# Patient Record
Sex: Female | Born: 1950 | Race: White | Hispanic: No | Marital: Single | Smoking: Former smoker
Health system: Southern US, Community
[De-identification: ages and names within clinical notes are randomized; demographics above are authoritative.]

---

## 2015-09-26 DIAGNOSIS — Z23 Encounter for immunization: Secondary | ICD-10-CM | POA: Diagnosis not present

## 2016-01-22 DIAGNOSIS — K130 Diseases of lips: Secondary | ICD-10-CM | POA: Diagnosis not present

## 2016-01-22 DIAGNOSIS — M810 Age-related osteoporosis without current pathological fracture: Secondary | ICD-10-CM | POA: Diagnosis not present

## 2016-01-22 DIAGNOSIS — F329 Major depressive disorder, single episode, unspecified: Secondary | ICD-10-CM | POA: Diagnosis not present

## 2016-01-22 DIAGNOSIS — E7800 Pure hypercholesterolemia, unspecified: Secondary | ICD-10-CM | POA: Diagnosis not present

## 2016-01-22 DIAGNOSIS — E039 Hypothyroidism, unspecified: Secondary | ICD-10-CM | POA: Diagnosis not present

## 2016-01-22 DIAGNOSIS — K047 Periapical abscess without sinus: Secondary | ICD-10-CM | POA: Diagnosis not present

## 2016-01-22 DIAGNOSIS — J45909 Unspecified asthma, uncomplicated: Secondary | ICD-10-CM | POA: Diagnosis not present

## 2016-08-06 DIAGNOSIS — E039 Hypothyroidism, unspecified: Secondary | ICD-10-CM | POA: Diagnosis not present

## 2016-08-06 DIAGNOSIS — M810 Age-related osteoporosis without current pathological fracture: Secondary | ICD-10-CM | POA: Diagnosis not present

## 2016-08-06 DIAGNOSIS — R636 Underweight: Secondary | ICD-10-CM | POA: Diagnosis not present

## 2016-08-06 DIAGNOSIS — E7800 Pure hypercholesterolemia, unspecified: Secondary | ICD-10-CM | POA: Diagnosis not present

## 2016-08-06 DIAGNOSIS — Z23 Encounter for immunization: Secondary | ICD-10-CM | POA: Diagnosis not present

## 2016-08-06 DIAGNOSIS — Z0000 Encounter for general adult medical examination without abnormal findings: Secondary | ICD-10-CM | POA: Diagnosis not present

## 2016-08-06 DIAGNOSIS — K130 Diseases of lips: Secondary | ICD-10-CM | POA: Diagnosis not present

## 2016-08-06 DIAGNOSIS — F339 Major depressive disorder, recurrent, unspecified: Secondary | ICD-10-CM | POA: Diagnosis not present

## 2016-08-08 ENCOUNTER — Other Ambulatory Visit: Payer: Self-pay | Admitting: Family Medicine

## 2016-08-08 DIAGNOSIS — Z1231 Encounter for screening mammogram for malignant neoplasm of breast: Secondary | ICD-10-CM

## 2016-08-14 ENCOUNTER — Ambulatory Visit
Admission: RE | Admit: 2016-08-14 | Discharge: 2016-08-14 | Disposition: A | Discharge status: Home / Self Care | Payer: Medicare Other | Source: Ambulatory Visit | Attending: Family Medicine | Admitting: Family Medicine

## 2016-08-14 DIAGNOSIS — Z1231 Encounter for screening mammogram for malignant neoplasm of breast: Secondary | ICD-10-CM

## 2016-08-15 ENCOUNTER — Other Ambulatory Visit: Payer: Self-pay | Admitting: Family Medicine

## 2016-08-15 DIAGNOSIS — R928 Other abnormal and inconclusive findings on diagnostic imaging of breast: Secondary | ICD-10-CM

## 2016-09-16 ENCOUNTER — Other Ambulatory Visit: Payer: Medicare Other

## 2016-09-23 ENCOUNTER — Ambulatory Visit
Admission: RE | Admit: 2016-09-23 | Discharge: 2016-09-23 | Disposition: A | Discharge status: Home / Self Care | Payer: Medicare Other | Source: Ambulatory Visit | Attending: Family Medicine | Admitting: Family Medicine

## 2016-09-23 DIAGNOSIS — N6489 Other specified disorders of breast: Secondary | ICD-10-CM | POA: Diagnosis not present

## 2016-09-23 DIAGNOSIS — R922 Inconclusive mammogram: Secondary | ICD-10-CM | POA: Diagnosis not present

## 2016-09-23 DIAGNOSIS — R928 Other abnormal and inconclusive findings on diagnostic imaging of breast: Secondary | ICD-10-CM

## 2016-10-03 DIAGNOSIS — F418 Other specified anxiety disorders: Secondary | ICD-10-CM | POA: Diagnosis not present

## 2016-10-03 DIAGNOSIS — F339 Major depressive disorder, recurrent, unspecified: Secondary | ICD-10-CM | POA: Diagnosis not present

## 2016-10-03 DIAGNOSIS — M810 Age-related osteoporosis without current pathological fracture: Secondary | ICD-10-CM | POA: Diagnosis not present

## 2017-01-08 DIAGNOSIS — J45909 Unspecified asthma, uncomplicated: Secondary | ICD-10-CM | POA: Diagnosis not present

## 2017-01-08 DIAGNOSIS — J988 Other specified respiratory disorders: Secondary | ICD-10-CM | POA: Diagnosis not present

## 2017-02-25 DIAGNOSIS — M25562 Pain in left knee: Secondary | ICD-10-CM | POA: Diagnosis not present

## 2017-04-14 DIAGNOSIS — M25562 Pain in left knee: Secondary | ICD-10-CM | POA: Diagnosis not present

## 2017-06-13 DIAGNOSIS — L308 Other specified dermatitis: Secondary | ICD-10-CM | POA: Diagnosis not present

## 2017-06-17 DIAGNOSIS — L710 Perioral dermatitis: Secondary | ICD-10-CM | POA: Diagnosis not present

## 2017-06-17 DIAGNOSIS — K130 Diseases of lips: Secondary | ICD-10-CM | POA: Diagnosis not present

## 2017-08-04 DIAGNOSIS — Z23 Encounter for immunization: Secondary | ICD-10-CM | POA: Diagnosis not present

## 2017-08-14 DIAGNOSIS — E039 Hypothyroidism, unspecified: Secondary | ICD-10-CM | POA: Diagnosis not present

## 2017-08-14 DIAGNOSIS — E7800 Pure hypercholesterolemia, unspecified: Secondary | ICD-10-CM | POA: Diagnosis not present

## 2017-08-19 DIAGNOSIS — F339 Major depressive disorder, recurrent, unspecified: Secondary | ICD-10-CM | POA: Diagnosis not present

## 2017-08-19 DIAGNOSIS — M810 Age-related osteoporosis without current pathological fracture: Secondary | ICD-10-CM | POA: Diagnosis not present

## 2017-08-19 DIAGNOSIS — F418 Other specified anxiety disorders: Secondary | ICD-10-CM | POA: Diagnosis not present

## 2017-08-19 DIAGNOSIS — E039 Hypothyroidism, unspecified: Secondary | ICD-10-CM | POA: Diagnosis not present

## 2017-08-19 DIAGNOSIS — Z0000 Encounter for general adult medical examination without abnormal findings: Secondary | ICD-10-CM | POA: Diagnosis not present

## 2017-08-19 DIAGNOSIS — E7800 Pure hypercholesterolemia, unspecified: Secondary | ICD-10-CM | POA: Diagnosis not present

## 2017-08-20 ENCOUNTER — Other Ambulatory Visit: Payer: Self-pay | Admitting: Family Medicine

## 2017-08-20 DIAGNOSIS — M810 Age-related osteoporosis without current pathological fracture: Secondary | ICD-10-CM

## 2017-09-03 ENCOUNTER — Other Ambulatory Visit: Payer: Self-pay | Admitting: Family Medicine

## 2017-09-03 DIAGNOSIS — Z139 Encounter for screening, unspecified: Secondary | ICD-10-CM

## 2017-10-02 ENCOUNTER — Ambulatory Visit
Admission: RE | Admit: 2017-10-02 | Discharge: 2017-10-02 | Disposition: A | Discharge status: Home / Self Care | Payer: Medicare Other | Source: Ambulatory Visit | Attending: Family Medicine | Admitting: Family Medicine

## 2017-10-02 DIAGNOSIS — Z139 Encounter for screening, unspecified: Secondary | ICD-10-CM

## 2017-10-02 DIAGNOSIS — Z1231 Encounter for screening mammogram for malignant neoplasm of breast: Secondary | ICD-10-CM | POA: Diagnosis not present

## 2017-11-02 ENCOUNTER — Ambulatory Visit
Admission: RE | Admit: 2017-11-02 | Discharge: 2017-11-02 | Disposition: A | Discharge status: Home / Self Care | Payer: Medicare Other | Source: Ambulatory Visit | Attending: Family Medicine | Admitting: Family Medicine

## 2017-11-02 DIAGNOSIS — M810 Age-related osteoporosis without current pathological fracture: Secondary | ICD-10-CM

## 2017-11-02 DIAGNOSIS — Z780 Asymptomatic menopausal state: Secondary | ICD-10-CM | POA: Diagnosis not present

## 2018-04-06 DIAGNOSIS — H43813 Vitreous degeneration, bilateral: Secondary | ICD-10-CM | POA: Diagnosis not present

## 2018-04-06 DIAGNOSIS — H40013 Open angle with borderline findings, low risk, bilateral: Secondary | ICD-10-CM | POA: Diagnosis not present

## 2018-04-06 DIAGNOSIS — H2513 Age-related nuclear cataract, bilateral: Secondary | ICD-10-CM | POA: Diagnosis not present

## 2018-05-18 DIAGNOSIS — H40013 Open angle with borderline findings, low risk, bilateral: Secondary | ICD-10-CM | POA: Diagnosis not present

## 2018-05-18 DIAGNOSIS — H43813 Vitreous degeneration, bilateral: Secondary | ICD-10-CM | POA: Diagnosis not present

## 2018-05-18 DIAGNOSIS — H2513 Age-related nuclear cataract, bilateral: Secondary | ICD-10-CM | POA: Diagnosis not present

## 2018-08-03 DIAGNOSIS — Z23 Encounter for immunization: Secondary | ICD-10-CM | POA: Diagnosis not present

## 2018-09-06 DIAGNOSIS — Z0000 Encounter for general adult medical examination without abnormal findings: Secondary | ICD-10-CM | POA: Diagnosis not present

## 2018-09-06 DIAGNOSIS — E7800 Pure hypercholesterolemia, unspecified: Secondary | ICD-10-CM | POA: Diagnosis not present

## 2018-09-06 DIAGNOSIS — M810 Age-related osteoporosis without current pathological fracture: Secondary | ICD-10-CM | POA: Diagnosis not present

## 2018-09-06 DIAGNOSIS — F339 Major depressive disorder, recurrent, unspecified: Secondary | ICD-10-CM | POA: Diagnosis not present

## 2018-09-06 DIAGNOSIS — E039 Hypothyroidism, unspecified: Secondary | ICD-10-CM | POA: Diagnosis not present

## 2018-09-06 DIAGNOSIS — J45909 Unspecified asthma, uncomplicated: Secondary | ICD-10-CM | POA: Diagnosis not present

## 2018-09-06 DIAGNOSIS — F418 Other specified anxiety disorders: Secondary | ICD-10-CM | POA: Diagnosis not present

## 2018-11-30 DIAGNOSIS — K1329 Other disturbances of oral epithelium, including tongue: Secondary | ICD-10-CM | POA: Diagnosis not present

## 2018-12-07 DIAGNOSIS — K1329 Other disturbances of oral epithelium, including tongue: Secondary | ICD-10-CM | POA: Diagnosis not present

## 2018-12-29 ENCOUNTER — Other Ambulatory Visit: Payer: Self-pay | Admitting: Family Medicine

## 2018-12-29 DIAGNOSIS — Z1231 Encounter for screening mammogram for malignant neoplasm of breast: Secondary | ICD-10-CM

## 2019-01-27 ENCOUNTER — Ambulatory Visit: Payer: Medicare Other

## 2019-03-16 ENCOUNTER — Ambulatory Visit
Admission: RE | Admit: 2019-03-16 | Discharge: 2019-03-16 | Disposition: A | Discharge status: Home / Self Care | Payer: Medicare Other | Source: Ambulatory Visit | Attending: Family Medicine | Admitting: Family Medicine

## 2019-03-16 ENCOUNTER — Other Ambulatory Visit: Payer: Self-pay

## 2019-03-16 DIAGNOSIS — Z1231 Encounter for screening mammogram for malignant neoplasm of breast: Secondary | ICD-10-CM

## 2019-08-05 DIAGNOSIS — E7800 Pure hypercholesterolemia, unspecified: Secondary | ICD-10-CM | POA: Diagnosis not present

## 2019-08-05 DIAGNOSIS — J45909 Unspecified asthma, uncomplicated: Secondary | ICD-10-CM | POA: Diagnosis not present

## 2019-08-05 DIAGNOSIS — M810 Age-related osteoporosis without current pathological fracture: Secondary | ICD-10-CM | POA: Diagnosis not present

## 2019-08-05 DIAGNOSIS — F339 Major depressive disorder, recurrent, unspecified: Secondary | ICD-10-CM | POA: Diagnosis not present

## 2019-08-05 DIAGNOSIS — E039 Hypothyroidism, unspecified: Secondary | ICD-10-CM | POA: Diagnosis not present

## 2019-08-08 DIAGNOSIS — Z23 Encounter for immunization: Secondary | ICD-10-CM | POA: Diagnosis not present

## 2019-10-26 DIAGNOSIS — E7800 Pure hypercholesterolemia, unspecified: Secondary | ICD-10-CM | POA: Diagnosis not present

## 2019-10-26 DIAGNOSIS — F339 Major depressive disorder, recurrent, unspecified: Secondary | ICD-10-CM | POA: Diagnosis not present

## 2019-10-26 DIAGNOSIS — J45909 Unspecified asthma, uncomplicated: Secondary | ICD-10-CM | POA: Diagnosis not present

## 2019-10-26 DIAGNOSIS — M810 Age-related osteoporosis without current pathological fracture: Secondary | ICD-10-CM | POA: Diagnosis not present

## 2019-10-26 DIAGNOSIS — E039 Hypothyroidism, unspecified: Secondary | ICD-10-CM | POA: Diagnosis not present

## 2019-11-26 ENCOUNTER — Ambulatory Visit: Payer: Medicare Other

## 2019-12-01 ENCOUNTER — Ambulatory Visit: Payer: Medicare Other | Attending: Internal Medicine

## 2019-12-01 ENCOUNTER — Ambulatory Visit: Payer: Medicare Other

## 2019-12-01 DIAGNOSIS — Z23 Encounter for immunization: Secondary | ICD-10-CM | POA: Insufficient documentation

## 2019-12-01 NOTE — Progress Notes (Signed)
   Covid-19 Vaccination Clinic  Name:  Monica Castro    MRN: [id number] DOB: [birth year]  12/01/2019  [name] was observed post Covid-19 immunization for 15 minutes without incidence. She was provided with Vaccine Information Sheet and instruction to access the V-Safe system.   [name] was instructed to call 911 with any severe reactions post vaccine: Marland Kitchen Difficulty breathing  . Swelling of your face and throat  . A fast heartbeat  . A bad rash all over your body  . Dizziness and weakness    Immunizations Administered    Name Date Dose VIS Date Route   Pfizer COVID-19 Vaccine 12/01/2019  9:19 AM 0.3 mL 10/07/2019 Intramuscular   Manufacturer: [employer], [employer]   Lot: [id number]   NDC: [address]-9

## 2019-12-26 ENCOUNTER — Ambulatory Visit: Payer: Medicare Other | Attending: Internal Medicine

## 2019-12-26 DIAGNOSIS — Z23 Encounter for immunization: Secondary | ICD-10-CM | POA: Insufficient documentation

## 2019-12-26 NOTE — Progress Notes (Signed)
   Covid-19 Vaccination Clinic  Name:  Monica Castro    MRN: [id number] DOB: 1951-07-23  12/26/2019  Monica Castro was observed post Covid-19 immunization for 15 minutes without incidence. She was provided with Vaccine Information Sheet and instruction to access the V-Safe system.   Monica Castro was instructed to call 911 with any severe reactions post vaccine: Marland Kitchen Difficulty breathing  . Swelling of your face and throat  . A fast heartbeat  . A bad rash all over your body  . Dizziness and weakness    Immunizations Administered    Name Date Dose VIS Date Route   Pfizer COVID-19 Vaccine 12/26/2019  9:36 AM 0.3 mL 10/07/2019 Intramuscular   Manufacturer: [employer], [employer]   Lot: [id number]   NDC: [address]-1

## 2019-12-28 DIAGNOSIS — E039 Hypothyroidism, unspecified: Secondary | ICD-10-CM | POA: Diagnosis not present

## 2019-12-28 DIAGNOSIS — M810 Age-related osteoporosis without current pathological fracture: Secondary | ICD-10-CM | POA: Diagnosis not present

## 2019-12-28 DIAGNOSIS — J45909 Unspecified asthma, uncomplicated: Secondary | ICD-10-CM | POA: Diagnosis not present

## 2019-12-28 DIAGNOSIS — F339 Major depressive disorder, recurrent, unspecified: Secondary | ICD-10-CM | POA: Diagnosis not present

## 2019-12-28 DIAGNOSIS — E7800 Pure hypercholesterolemia, unspecified: Secondary | ICD-10-CM | POA: Diagnosis not present

## 2020-02-27 ENCOUNTER — Other Ambulatory Visit: Payer: Self-pay | Admitting: Family Medicine

## 2020-02-27 DIAGNOSIS — Z1231 Encounter for screening mammogram for malignant neoplasm of breast: Secondary | ICD-10-CM

## 2020-03-13 DIAGNOSIS — M1712 Unilateral primary osteoarthritis, left knee: Secondary | ICD-10-CM | POA: Diagnosis not present

## 2020-03-13 DIAGNOSIS — M25562 Pain in left knee: Secondary | ICD-10-CM | POA: Diagnosis not present

## 2020-03-16 ENCOUNTER — Other Ambulatory Visit: Payer: Self-pay

## 2020-03-16 ENCOUNTER — Ambulatory Visit
Admission: RE | Admit: 2020-03-16 | Discharge: 2020-03-16 | Disposition: A | Discharge status: Home / Self Care | Payer: Medicare Other | Source: Ambulatory Visit | Attending: Family Medicine | Admitting: Family Medicine

## 2020-03-16 DIAGNOSIS — Z1231 Encounter for screening mammogram for malignant neoplasm of breast: Secondary | ICD-10-CM

## 2020-04-16 DIAGNOSIS — F339 Major depressive disorder, recurrent, unspecified: Secondary | ICD-10-CM | POA: Diagnosis not present

## 2020-04-16 DIAGNOSIS — E039 Hypothyroidism, unspecified: Secondary | ICD-10-CM | POA: Diagnosis not present

## 2020-04-16 DIAGNOSIS — J45909 Unspecified asthma, uncomplicated: Secondary | ICD-10-CM | POA: Diagnosis not present

## 2020-04-16 DIAGNOSIS — M810 Age-related osteoporosis without current pathological fracture: Secondary | ICD-10-CM | POA: Diagnosis not present

## 2020-04-16 DIAGNOSIS — E7800 Pure hypercholesterolemia, unspecified: Secondary | ICD-10-CM | POA: Diagnosis not present

## 2020-06-12 DIAGNOSIS — E7800 Pure hypercholesterolemia, unspecified: Secondary | ICD-10-CM | POA: Diagnosis not present

## 2020-06-12 DIAGNOSIS — E039 Hypothyroidism, unspecified: Secondary | ICD-10-CM | POA: Diagnosis not present

## 2020-06-12 DIAGNOSIS — J45909 Unspecified asthma, uncomplicated: Secondary | ICD-10-CM | POA: Diagnosis not present

## 2020-06-12 DIAGNOSIS — M810 Age-related osteoporosis without current pathological fracture: Secondary | ICD-10-CM | POA: Diagnosis not present

## 2020-06-12 DIAGNOSIS — F339 Major depressive disorder, recurrent, unspecified: Secondary | ICD-10-CM | POA: Diagnosis not present

## 2020-08-08 DIAGNOSIS — Z23 Encounter for immunization: Secondary | ICD-10-CM | POA: Diagnosis not present

## 2020-08-17 DIAGNOSIS — J45909 Unspecified asthma, uncomplicated: Secondary | ICD-10-CM | POA: Diagnosis not present

## 2020-08-17 DIAGNOSIS — M810 Age-related osteoporosis without current pathological fracture: Secondary | ICD-10-CM | POA: Diagnosis not present

## 2020-08-17 DIAGNOSIS — E039 Hypothyroidism, unspecified: Secondary | ICD-10-CM | POA: Diagnosis not present

## 2020-08-17 DIAGNOSIS — E7800 Pure hypercholesterolemia, unspecified: Secondary | ICD-10-CM | POA: Diagnosis not present

## 2020-08-17 DIAGNOSIS — F339 Major depressive disorder, recurrent, unspecified: Secondary | ICD-10-CM | POA: Diagnosis not present

## 2020-08-17 DIAGNOSIS — E785 Hyperlipidemia, unspecified: Secondary | ICD-10-CM | POA: Diagnosis not present

## 2020-08-25 ENCOUNTER — Ambulatory Visit: Payer: Medicare Other | Attending: Internal Medicine

## 2020-08-25 DIAGNOSIS — Z23 Encounter for immunization: Secondary | ICD-10-CM

## 2020-08-25 NOTE — Progress Notes (Signed)
   Covid-19 Vaccination Clinic  Name:  Monica Castro    MRN: [id number] DOB: 14-Aug-1951  08/25/2020  [name] was observed post Covid-19 immunization for 15 minutes without incident. She was provided with Vaccine Information Sheet and instruction to access the V-Safe system.   [name] was instructed to call 911 with any severe reactions post vaccine: Marland Kitchen Difficulty breathing  . Swelling of face and throat  . A fast heartbeat  . A bad rash all over body  . Dizziness and weakness

## 2020-09-05 DIAGNOSIS — F339 Major depressive disorder, recurrent, unspecified: Secondary | ICD-10-CM | POA: Diagnosis not present

## 2020-09-05 DIAGNOSIS — J45909 Unspecified asthma, uncomplicated: Secondary | ICD-10-CM | POA: Diagnosis not present

## 2020-09-05 DIAGNOSIS — G4700 Insomnia, unspecified: Secondary | ICD-10-CM | POA: Diagnosis not present

## 2020-09-05 DIAGNOSIS — M810 Age-related osteoporosis without current pathological fracture: Secondary | ICD-10-CM | POA: Diagnosis not present

## 2020-09-05 DIAGNOSIS — E7800 Pure hypercholesterolemia, unspecified: Secondary | ICD-10-CM | POA: Diagnosis not present

## 2020-09-05 DIAGNOSIS — E039 Hypothyroidism, unspecified: Secondary | ICD-10-CM | POA: Diagnosis not present

## 2020-09-05 DIAGNOSIS — F418 Other specified anxiety disorders: Secondary | ICD-10-CM | POA: Diagnosis not present

## 2020-10-17 DIAGNOSIS — M810 Age-related osteoporosis without current pathological fracture: Secondary | ICD-10-CM | POA: Diagnosis not present

## 2020-10-17 DIAGNOSIS — G4700 Insomnia, unspecified: Secondary | ICD-10-CM | POA: Diagnosis not present

## 2020-10-17 DIAGNOSIS — F339 Major depressive disorder, recurrent, unspecified: Secondary | ICD-10-CM | POA: Diagnosis not present

## 2020-10-17 DIAGNOSIS — E039 Hypothyroidism, unspecified: Secondary | ICD-10-CM | POA: Diagnosis not present

## 2020-10-17 DIAGNOSIS — E7800 Pure hypercholesterolemia, unspecified: Secondary | ICD-10-CM | POA: Diagnosis not present

## 2020-10-17 DIAGNOSIS — E785 Hyperlipidemia, unspecified: Secondary | ICD-10-CM | POA: Diagnosis not present

## 2020-10-17 DIAGNOSIS — J45909 Unspecified asthma, uncomplicated: Secondary | ICD-10-CM | POA: Diagnosis not present

## 2020-11-27 DIAGNOSIS — E7800 Pure hypercholesterolemia, unspecified: Secondary | ICD-10-CM | POA: Diagnosis not present

## 2020-11-27 DIAGNOSIS — M810 Age-related osteoporosis without current pathological fracture: Secondary | ICD-10-CM | POA: Diagnosis not present

## 2020-11-27 DIAGNOSIS — R195 Other fecal abnormalities: Secondary | ICD-10-CM | POA: Diagnosis not present

## 2020-11-27 DIAGNOSIS — F339 Major depressive disorder, recurrent, unspecified: Secondary | ICD-10-CM | POA: Diagnosis not present

## 2020-11-27 DIAGNOSIS — Z1211 Encounter for screening for malignant neoplasm of colon: Secondary | ICD-10-CM | POA: Diagnosis not present

## 2020-11-27 DIAGNOSIS — Z1159 Encounter for screening for other viral diseases: Secondary | ICD-10-CM | POA: Diagnosis not present

## 2020-11-27 DIAGNOSIS — E039 Hypothyroidism, unspecified: Secondary | ICD-10-CM | POA: Diagnosis not present

## 2020-11-27 DIAGNOSIS — Z0000 Encounter for general adult medical examination without abnormal findings: Secondary | ICD-10-CM | POA: Diagnosis not present

## 2020-11-30 DIAGNOSIS — F339 Major depressive disorder, recurrent, unspecified: Secondary | ICD-10-CM | POA: Diagnosis not present

## 2020-11-30 DIAGNOSIS — E785 Hyperlipidemia, unspecified: Secondary | ICD-10-CM | POA: Diagnosis not present

## 2020-11-30 DIAGNOSIS — G4700 Insomnia, unspecified: Secondary | ICD-10-CM | POA: Diagnosis not present

## 2020-11-30 DIAGNOSIS — J45909 Unspecified asthma, uncomplicated: Secondary | ICD-10-CM | POA: Diagnosis not present

## 2020-11-30 DIAGNOSIS — E7800 Pure hypercholesterolemia, unspecified: Secondary | ICD-10-CM | POA: Diagnosis not present

## 2020-11-30 DIAGNOSIS — M810 Age-related osteoporosis without current pathological fracture: Secondary | ICD-10-CM | POA: Diagnosis not present

## 2020-11-30 DIAGNOSIS — E039 Hypothyroidism, unspecified: Secondary | ICD-10-CM | POA: Diagnosis not present

## 2020-12-10 ENCOUNTER — Other Ambulatory Visit: Payer: Self-pay | Admitting: Family Medicine

## 2020-12-10 DIAGNOSIS — M810 Age-related osteoporosis without current pathological fracture: Secondary | ICD-10-CM

## 2021-01-10 ENCOUNTER — Other Ambulatory Visit: Payer: Self-pay | Admitting: Physician Assistant

## 2021-01-10 DIAGNOSIS — Z1211 Encounter for screening for malignant neoplasm of colon: Secondary | ICD-10-CM | POA: Diagnosis not present

## 2021-01-10 DIAGNOSIS — R1314 Dysphagia, pharyngoesophageal phase: Secondary | ICD-10-CM | POA: Diagnosis not present

## 2021-01-10 DIAGNOSIS — R197 Diarrhea, unspecified: Secondary | ICD-10-CM | POA: Diagnosis not present

## 2021-01-11 ENCOUNTER — Ambulatory Visit
Admission: RE | Admit: 2021-01-11 | Discharge: 2021-01-11 | Disposition: A | Discharge status: Home / Self Care | Payer: Medicare Other | Source: Ambulatory Visit | Attending: Physician Assistant | Admitting: Physician Assistant

## 2021-01-11 ENCOUNTER — Other Ambulatory Visit: Payer: Self-pay | Admitting: Physician Assistant

## 2021-01-11 DIAGNOSIS — R1310 Dysphagia, unspecified: Secondary | ICD-10-CM | POA: Diagnosis not present

## 2021-01-11 DIAGNOSIS — R1314 Dysphagia, pharyngoesophageal phase: Secondary | ICD-10-CM

## 2021-01-24 DIAGNOSIS — E7800 Pure hypercholesterolemia, unspecified: Secondary | ICD-10-CM | POA: Diagnosis not present

## 2021-01-24 DIAGNOSIS — E785 Hyperlipidemia, unspecified: Secondary | ICD-10-CM | POA: Diagnosis not present

## 2021-01-24 DIAGNOSIS — J45909 Unspecified asthma, uncomplicated: Secondary | ICD-10-CM | POA: Diagnosis not present

## 2021-01-24 DIAGNOSIS — E039 Hypothyroidism, unspecified: Secondary | ICD-10-CM | POA: Diagnosis not present

## 2021-01-24 DIAGNOSIS — M810 Age-related osteoporosis without current pathological fracture: Secondary | ICD-10-CM | POA: Diagnosis not present

## 2021-01-24 DIAGNOSIS — F339 Major depressive disorder, recurrent, unspecified: Secondary | ICD-10-CM | POA: Diagnosis not present

## 2021-01-24 DIAGNOSIS — G4700 Insomnia, unspecified: Secondary | ICD-10-CM | POA: Diagnosis not present

## 2021-02-12 DIAGNOSIS — L539 Erythematous condition, unspecified: Secondary | ICD-10-CM | POA: Diagnosis not present

## 2021-02-15 DIAGNOSIS — G4700 Insomnia, unspecified: Secondary | ICD-10-CM | POA: Diagnosis not present

## 2021-02-15 DIAGNOSIS — F339 Major depressive disorder, recurrent, unspecified: Secondary | ICD-10-CM | POA: Diagnosis not present

## 2021-02-15 DIAGNOSIS — E7800 Pure hypercholesterolemia, unspecified: Secondary | ICD-10-CM | POA: Diagnosis not present

## 2021-02-15 DIAGNOSIS — E039 Hypothyroidism, unspecified: Secondary | ICD-10-CM | POA: Diagnosis not present

## 2021-02-15 DIAGNOSIS — J45909 Unspecified asthma, uncomplicated: Secondary | ICD-10-CM | POA: Diagnosis not present

## 2021-02-15 DIAGNOSIS — E785 Hyperlipidemia, unspecified: Secondary | ICD-10-CM | POA: Diagnosis not present

## 2021-02-15 DIAGNOSIS — M810 Age-related osteoporosis without current pathological fracture: Secondary | ICD-10-CM | POA: Diagnosis not present

## 2021-02-27 DIAGNOSIS — K2289 Other specified disease of esophagus: Secondary | ICD-10-CM | POA: Diagnosis not present

## 2021-02-27 DIAGNOSIS — K5730 Diverticulosis of large intestine without perforation or abscess without bleeding: Secondary | ICD-10-CM | POA: Diagnosis not present

## 2021-02-27 DIAGNOSIS — K222 Esophageal obstruction: Secondary | ICD-10-CM | POA: Diagnosis not present

## 2021-02-27 DIAGNOSIS — R1314 Dysphagia, pharyngoesophageal phase: Secondary | ICD-10-CM | POA: Diagnosis not present

## 2021-02-27 DIAGNOSIS — Z1211 Encounter for screening for malignant neoplasm of colon: Secondary | ICD-10-CM | POA: Diagnosis not present

## 2021-03-01 DIAGNOSIS — K2289 Other specified disease of esophagus: Secondary | ICD-10-CM | POA: Diagnosis not present

## 2021-05-09 DIAGNOSIS — H2513 Age-related nuclear cataract, bilateral: Secondary | ICD-10-CM | POA: Diagnosis not present

## 2021-05-10 ENCOUNTER — Other Ambulatory Visit: Payer: Self-pay | Admitting: Family Medicine

## 2021-05-10 DIAGNOSIS — Z1231 Encounter for screening mammogram for malignant neoplasm of breast: Secondary | ICD-10-CM

## 2021-05-14 ENCOUNTER — Other Ambulatory Visit: Payer: Self-pay

## 2021-05-14 ENCOUNTER — Ambulatory Visit
Admission: RE | Admit: 2021-05-14 | Discharge: 2021-05-14 | Disposition: A | Discharge status: Home / Self Care | Payer: Medicare Other | Source: Ambulatory Visit | Attending: Family Medicine | Admitting: Family Medicine

## 2021-05-14 DIAGNOSIS — M810 Age-related osteoporosis without current pathological fracture: Secondary | ICD-10-CM | POA: Diagnosis not present

## 2021-05-14 DIAGNOSIS — M85851 Other specified disorders of bone density and structure, right thigh: Secondary | ICD-10-CM | POA: Diagnosis not present

## 2021-05-14 DIAGNOSIS — Z780 Asymptomatic menopausal state: Secondary | ICD-10-CM | POA: Diagnosis not present

## 2021-05-15 ENCOUNTER — Ambulatory Visit
Admission: RE | Admit: 2021-05-15 | Discharge: 2021-05-15 | Disposition: A | Discharge status: Home / Self Care | Payer: Medicare Other | Source: Ambulatory Visit | Attending: Family Medicine | Admitting: Family Medicine

## 2021-05-15 DIAGNOSIS — Z1231 Encounter for screening mammogram for malignant neoplasm of breast: Secondary | ICD-10-CM | POA: Diagnosis not present

## 2021-05-20 ENCOUNTER — Other Ambulatory Visit: Payer: Self-pay | Admitting: Family Medicine

## 2021-05-20 DIAGNOSIS — R928 Other abnormal and inconclusive findings on diagnostic imaging of breast: Secondary | ICD-10-CM

## 2021-05-29 DIAGNOSIS — F3341 Major depressive disorder, recurrent, in partial remission: Secondary | ICD-10-CM | POA: Diagnosis not present

## 2021-05-29 DIAGNOSIS — E7800 Pure hypercholesterolemia, unspecified: Secondary | ICD-10-CM | POA: Diagnosis not present

## 2021-05-29 DIAGNOSIS — J45909 Unspecified asthma, uncomplicated: Secondary | ICD-10-CM | POA: Diagnosis not present

## 2021-05-29 DIAGNOSIS — E039 Hypothyroidism, unspecified: Secondary | ICD-10-CM | POA: Diagnosis not present

## 2021-05-29 DIAGNOSIS — G4700 Insomnia, unspecified: Secondary | ICD-10-CM | POA: Diagnosis not present

## 2021-05-29 DIAGNOSIS — M810 Age-related osteoporosis without current pathological fracture: Secondary | ICD-10-CM | POA: Diagnosis not present

## 2021-06-07 ENCOUNTER — Other Ambulatory Visit: Payer: Medicare Other

## 2021-06-10 ENCOUNTER — Other Ambulatory Visit: Payer: Self-pay

## 2021-06-10 ENCOUNTER — Ambulatory Visit
Admission: RE | Admit: 2021-06-10 | Discharge: 2021-06-10 | Disposition: A | Discharge status: Home / Self Care | Payer: Medicare Other | Source: Ambulatory Visit | Attending: Family Medicine | Admitting: Family Medicine

## 2021-06-10 DIAGNOSIS — R922 Inconclusive mammogram: Secondary | ICD-10-CM | POA: Diagnosis not present

## 2021-06-10 DIAGNOSIS — R928 Other abnormal and inconclusive findings on diagnostic imaging of breast: Secondary | ICD-10-CM

## 2021-08-12 DIAGNOSIS — Z23 Encounter for immunization: Secondary | ICD-10-CM | POA: Diagnosis not present

## 2021-08-20 DIAGNOSIS — Z23 Encounter for immunization: Secondary | ICD-10-CM | POA: Diagnosis not present

## 2021-08-23 DIAGNOSIS — G4700 Insomnia, unspecified: Secondary | ICD-10-CM | POA: Diagnosis not present

## 2021-08-23 DIAGNOSIS — E039 Hypothyroidism, unspecified: Secondary | ICD-10-CM | POA: Diagnosis not present

## 2021-08-23 DIAGNOSIS — E7800 Pure hypercholesterolemia, unspecified: Secondary | ICD-10-CM | POA: Diagnosis not present

## 2021-08-23 DIAGNOSIS — E785 Hyperlipidemia, unspecified: Secondary | ICD-10-CM | POA: Diagnosis not present

## 2021-08-23 DIAGNOSIS — J45909 Unspecified asthma, uncomplicated: Secondary | ICD-10-CM | POA: Diagnosis not present

## 2021-08-23 DIAGNOSIS — F3341 Major depressive disorder, recurrent, in partial remission: Secondary | ICD-10-CM | POA: Diagnosis not present

## 2021-08-23 DIAGNOSIS — M810 Age-related osteoporosis without current pathological fracture: Secondary | ICD-10-CM | POA: Diagnosis not present

## 2021-09-02 DIAGNOSIS — E039 Hypothyroidism, unspecified: Secondary | ICD-10-CM | POA: Diagnosis not present

## 2021-12-04 DIAGNOSIS — Z1389 Encounter for screening for other disorder: Secondary | ICD-10-CM | POA: Diagnosis not present

## 2021-12-04 DIAGNOSIS — Z0000 Encounter for general adult medical examination without abnormal findings: Secondary | ICD-10-CM | POA: Diagnosis not present

## 2021-12-18 DIAGNOSIS — F339 Major depressive disorder, recurrent, unspecified: Secondary | ICD-10-CM | POA: Diagnosis not present

## 2021-12-18 DIAGNOSIS — J45909 Unspecified asthma, uncomplicated: Secondary | ICD-10-CM | POA: Diagnosis not present

## 2021-12-18 DIAGNOSIS — E039 Hypothyroidism, unspecified: Secondary | ICD-10-CM | POA: Diagnosis not present

## 2021-12-18 DIAGNOSIS — M810 Age-related osteoporosis without current pathological fracture: Secondary | ICD-10-CM | POA: Diagnosis not present

## 2021-12-18 DIAGNOSIS — F418 Other specified anxiety disorders: Secondary | ICD-10-CM | POA: Diagnosis not present

## 2021-12-18 DIAGNOSIS — E7800 Pure hypercholesterolemia, unspecified: Secondary | ICD-10-CM | POA: Diagnosis not present

## 2022-03-05 DIAGNOSIS — E039 Hypothyroidism, unspecified: Secondary | ICD-10-CM | POA: Diagnosis not present

## 2022-04-10 IMAGING — MG MM DIGITAL DIAGNOSTIC UNILAT*R* W/ TOMO W/ CAD
6 series · 6 of 18 positions shown · non-contrast
Comparison: Previous exam(s).

CLINICAL DATA: Possible asymmetry in the right breast on recent
screening mammogram.

EXAM:
DIGITAL DIAGNOSTIC UNILATERAL RIGHT MAMMOGRAM WITH TOMOSYNTHESIS AND
CAD; ULTRASOUND RIGHT BREAST LIMITED
TECHNIQUE: Right digital diagnostic mammography and breast tomosynthesis was
performed. The images were evaluated with computer-aided detection.;
Targeted ultrasound examination of the right breast was performed

[R MLO synth-2D (1 of 2)]
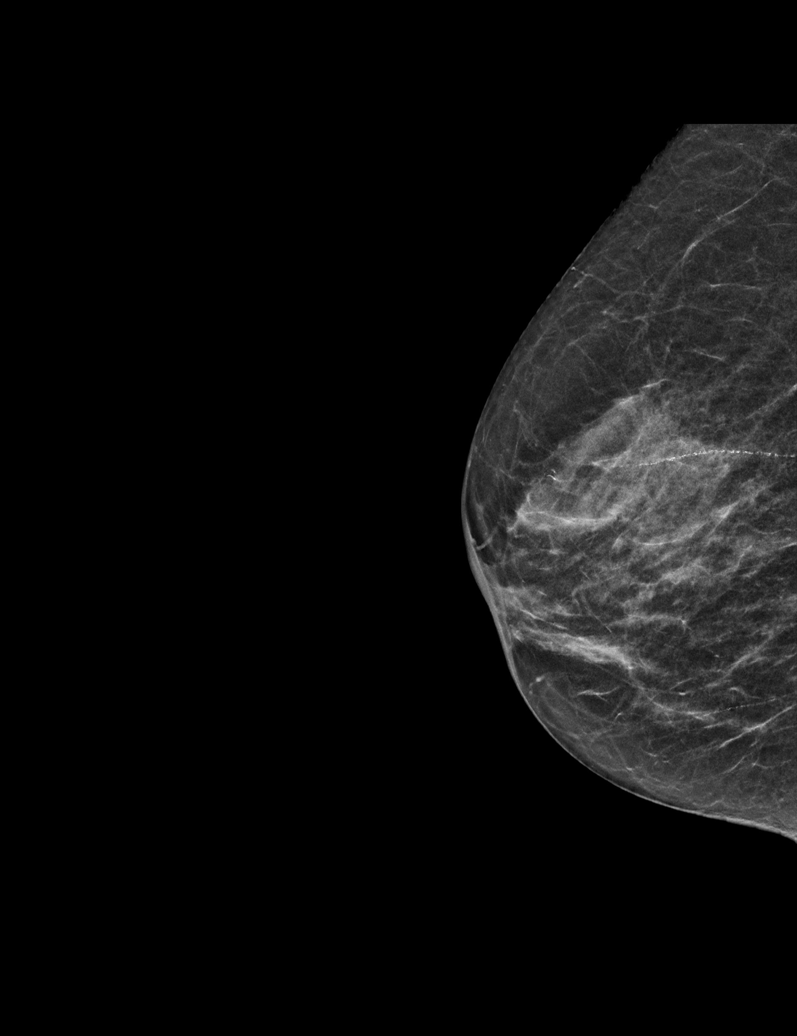

[R CC synth-2D]
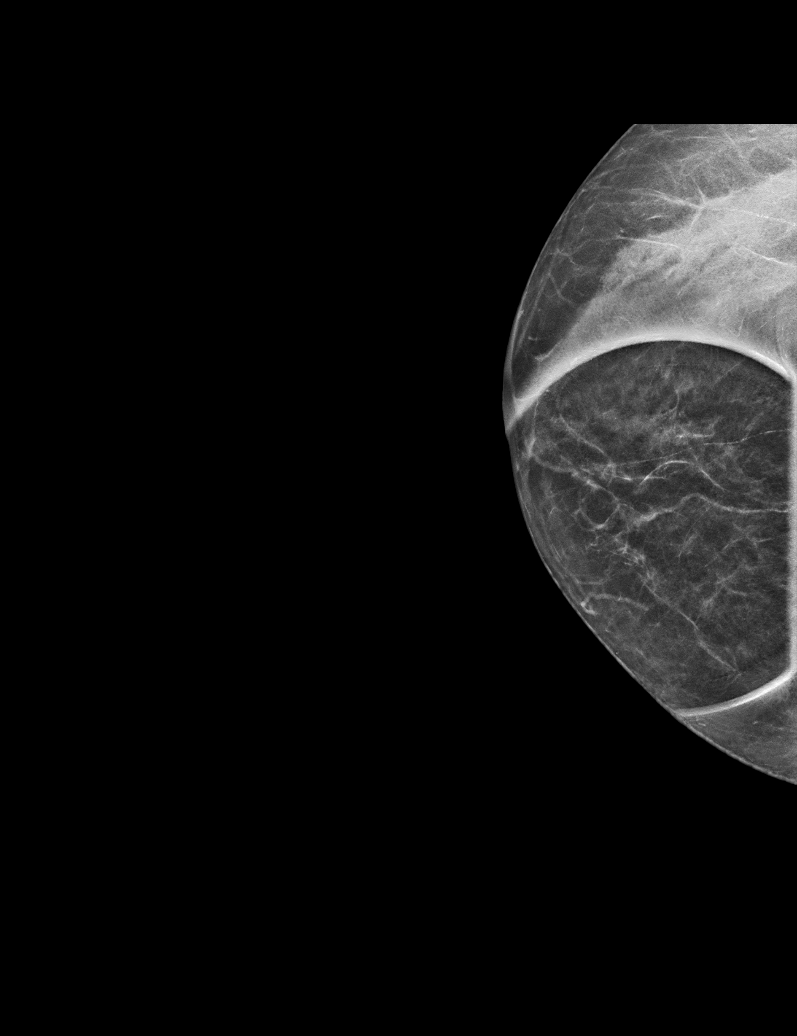

[R MLO synth-2D (2 of 2)]
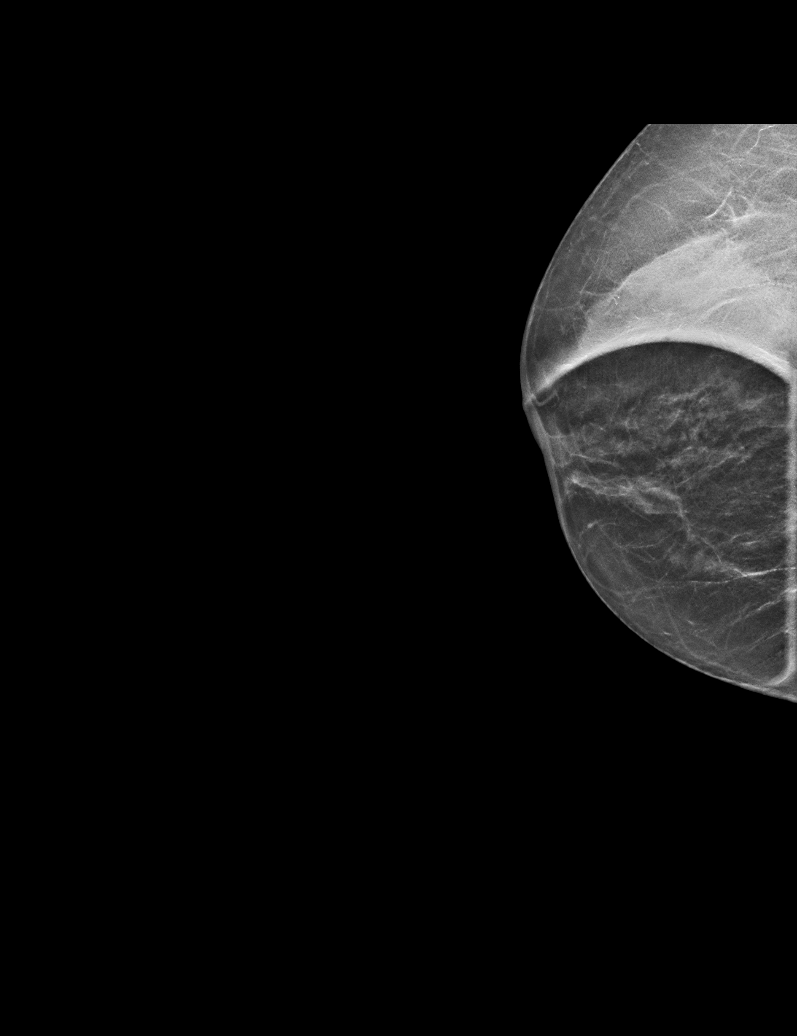

[R MLO tomo (1 of 2) · tomo slice 19/36.0]
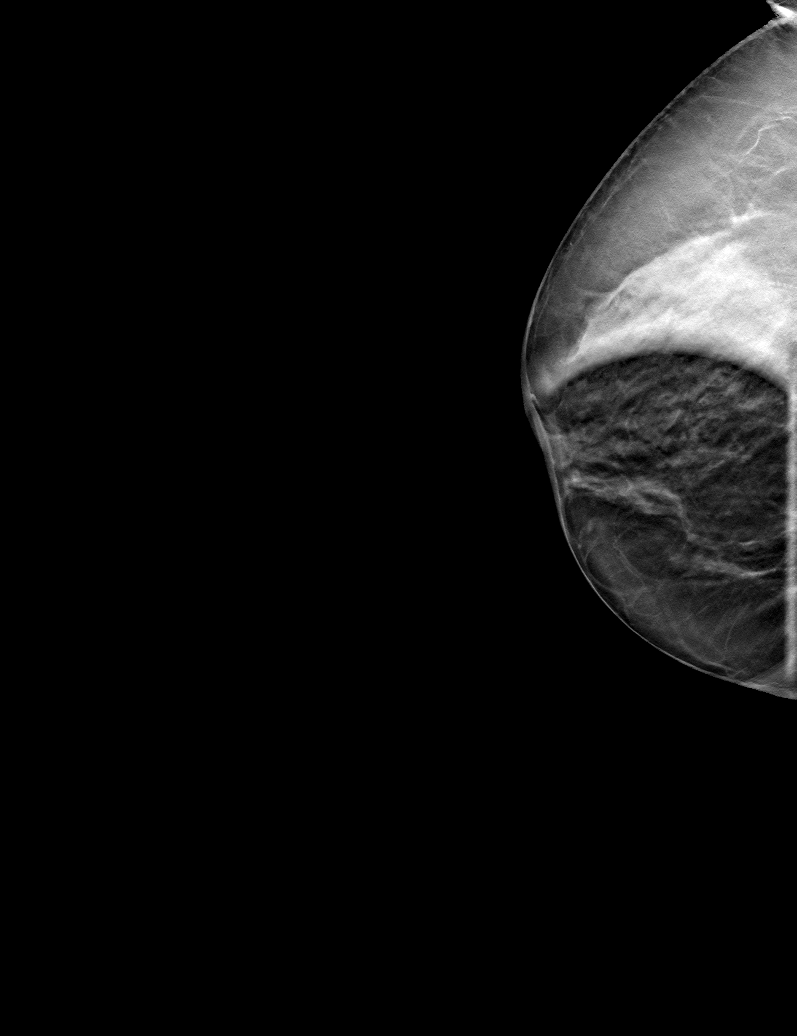

[R CC tomo · tomo slice 20/39.0]
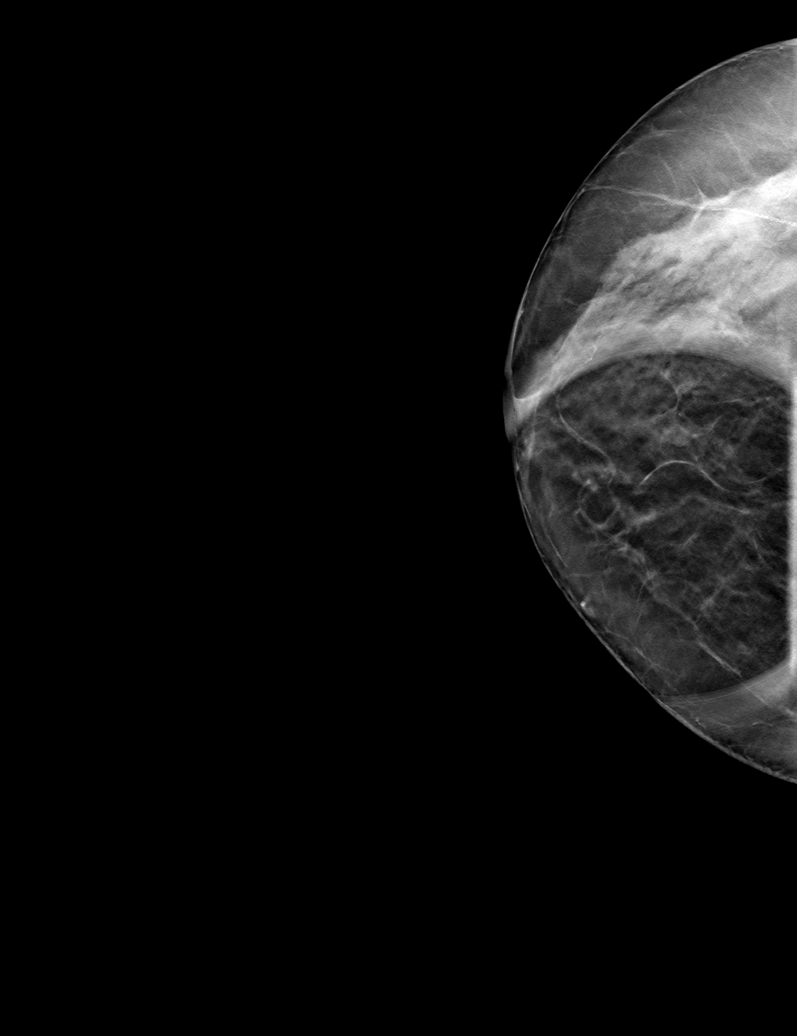

[R MLO tomo (2 of 2) · tomo slice 21/42.0]
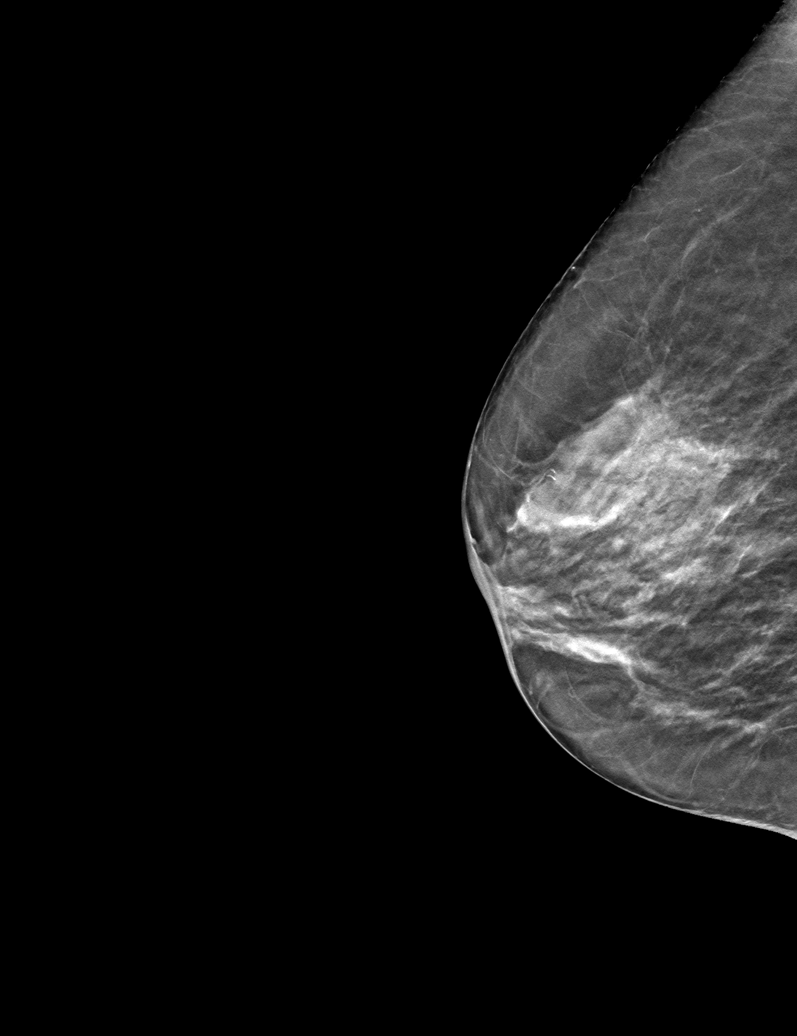

[6 of 18 positions shown; findings below may reference images not displayed]

ACR Breast Density Category c: The breast tissue is heterogeneously
dense, which may obscure small masses.
FINDINGS: Previously described possible asymmetry in the right lower inner
breast dissipates on some views and appears less prominent on other
views, suggestive of overlapping fibroglandular tissue.

Confirmatory targeted ultrasound of the right breast from the [DATE]
to [DATE] positions was performed. No suspicious solid or cystic mass.
IMPRESSION: No mammographic or sonographic findings of malignancy.

RECOMMENDATION:
Annual screening mammogram.

I have discussed the findings and recommendations with the patient.
If applicable, a reminder letter will be sent to the patient
regarding the next appointment.

BI-RADS CATEGORY  1: Negative.

## 2022-07-11 DIAGNOSIS — M810 Age-related osteoporosis without current pathological fracture: Secondary | ICD-10-CM | POA: Diagnosis not present

## 2022-07-30 ENCOUNTER — Other Ambulatory Visit: Payer: Self-pay | Admitting: Family Medicine

## 2022-07-30 DIAGNOSIS — Z1231 Encounter for screening mammogram for malignant neoplasm of breast: Secondary | ICD-10-CM

## 2022-08-05 DIAGNOSIS — Z23 Encounter for immunization: Secondary | ICD-10-CM | POA: Diagnosis not present

## 2022-08-14 DIAGNOSIS — E039 Hypothyroidism, unspecified: Secondary | ICD-10-CM | POA: Diagnosis not present

## 2022-08-14 DIAGNOSIS — F3341 Major depressive disorder, recurrent, in partial remission: Secondary | ICD-10-CM | POA: Diagnosis not present

## 2022-08-14 DIAGNOSIS — Z681 Body mass index (BMI) 19 or less, adult: Secondary | ICD-10-CM | POA: Diagnosis not present

## 2022-08-14 DIAGNOSIS — F418 Other specified anxiety disorders: Secondary | ICD-10-CM | POA: Diagnosis not present

## 2022-09-02 ENCOUNTER — Ambulatory Visit
Admission: RE | Admit: 2022-09-02 | Discharge: 2022-09-02 | Disposition: A | Discharge status: Home / Self Care | Payer: Medicare Other | Source: Ambulatory Visit | Attending: Family Medicine | Admitting: Family Medicine

## 2022-09-02 DIAGNOSIS — Z1231 Encounter for screening mammogram for malignant neoplasm of breast: Secondary | ICD-10-CM | POA: Diagnosis not present

## 2022-09-23 ENCOUNTER — Encounter: Payer: Self-pay | Admitting: Pulmonary Disease

## 2022-09-23 ENCOUNTER — Ambulatory Visit (INDEPENDENT_AMBULATORY_CARE_PROVIDER_SITE_OTHER): Payer: Medicare Other | Admitting: Pulmonary Disease

## 2022-09-23 VITALS — BP 116/72 | HR 69 | Ht 60.5 in | Wt 97.2 lb

## 2022-09-23 DIAGNOSIS — J4540 Moderate persistent asthma, uncomplicated: Secondary | ICD-10-CM

## 2022-09-23 LAB — CBC WITH DIFFERENTIAL/PLATELET
Basophils Absolute: 0.1 10*3/uL (ref 0.0–0.1)
Basophils Relative: 0.9 % (ref 0.0–3.0)
Eosinophils Absolute: 0.3 10*3/uL (ref 0.0–0.7)
Eosinophils Relative: 4.9 % (ref 0.0–5.0)
HCT: 43.2 % (ref 36.0–46.0)
Hemoglobin: 14.5 g/dL (ref 12.0–15.0)
Lymphocytes Relative: 22.1 % (ref 12.0–46.0)
Lymphs Abs: 1.3 10*3/uL (ref 0.7–4.0)
MCHC: 33.6 g/dL (ref 30.0–36.0)
MCV: 97.5 fl (ref 78.0–100.0)
Monocytes Absolute: 0.5 10*3/uL (ref 0.1–1.0)
Monocytes Relative: 8.3 % (ref 3.0–12.0)
Neutro Abs: 3.8 10*3/uL (ref 1.4–7.7)
Neutrophils Relative %: 63.8 % (ref 43.0–77.0)
Platelets: 247 10*3/uL (ref 150.0–400.0)
RBC: 4.44 Mil/uL (ref 3.87–5.11)
RDW: 13.3 % (ref 11.5–15.5)
WBC: 6 10*3/uL (ref 4.0–10.5)

## 2022-09-23 MED ORDER — TRELEGY ELLIPTA 100-62.5-25 MCG/ACT IN AEPB: 1.0000 | INHALATION_SPRAY | Freq: Every day | RESPIRATORY_TRACT | 0 refills | Status: DC

## 2022-09-23 NOTE — Patient Instructions (Addendum)
We will request your records from [facility] for your asthma and pulmonary nodule history  We will check labs today regarding your asthma   Try Trelegy ellipta 1 puff daily - rinse mouth out after each use  Follow up in 2 months with pulmonary function tests.

## 2022-09-23 NOTE — Progress Notes (Unsigned)
Synopsis: Referred in November 2023 for asthma  Subjective:   PATIENT ID: Monica Castro GENDER: female DOB: 02/05/1951, MRN: [id number]  HPI  Chief Complaint  Patient presents with   Consult    Self referral for asthma. States she was diagnosed back in 1973. States her asthma has been getting worse over the last year. Currently on Symbicort but her insurance will not cover it after the 1st of the year.    Monica Castro is a 71 year old woman, former smoker with history of asthma who is referred to pulmonary clinic for evaluation of her asthma.   She reports being diagnosed with asthma in 1973. She is currently on symbicort 160-4.4mcg 2 puffs twice daily. She reports her insurance company will not cover this medication after this year. She has tolerated breo in the past. She reports over the last month her asthma has been more active until it has quited down over the past 3 days. She reports over the past 1-1.5 years she has had an increase in dyspnea and chest tightness. She was using albuterol 3 times per day until the last couple of days. She has some sinus congestion and drainage.   She has a history of pulmonary nodule that was biopsied and benign.   She qui smoking in 1990. She smoked for 20 years.   No past medical history on file.   No family history on file.   Social History   Socioeconomic History   Marital status: Single    Spouse name: Not on file   Number of children: Not on file   Years of education: Not on file   Highest education level: Not on file  Occupational History   Not on file  Tobacco Use   Smoking status: Former    Types: Cigarettes    Start date: 05/30/1981    Quit date: 05/30/1989    Years since quitting: 33.3    Passive exposure: Never   Smokeless tobacco: Never  Substance and Sexual Activity   Alcohol use: Not on file   Drug use: Not on file   Sexual activity: Not on file  Other Topics Concern   Not on file  Social History Narrative   Not on file    Social Determinants of Health   Financial Resource Strain: Not on file  Food Insecurity: Not on file  Transportation Needs: Not on file  Physical Activity: Not on file  Stress: Not on file  Social Connections: Not on file  Intimate Partner Violence: Not on file     Allergies  Allergen Reactions   Cashew Nut Oil Anaphylaxis    Per patient, 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      Outpatient Medications Prior to Visit  Medication Sig Dispense Refill   atorvastatin (LIPITOR) 10 MG tablet Take 10 mg by mouth daily.     EPINEPHrine 0.3 mg/0.3 mL IJ SOAJ injection Inject 0.3 mg into the muscle as needed for anaphylaxis.     fluticasone (FLONASE) 50 MCG/ACT nasal spray Place 1 spray into both nostrils as needed for allergies or rhinitis.     levothyroxine (SYNTHROID) 75 MCG tablet Take 75 mcg by mouth every morning.     LORazepam (ATIVAN) 2 MG tablet Take 1-2 mg by mouth every 8 (eight) hours as needed.     PARoxetine (PAXIL) 40 MG tablet Take 40 mg by mouth every morning.     SYMBICORT 160-4.5 MCG/ACT inhaler Inhale 2 puffs into the lungs 2 (two) times  daily.     traZODone (DESYREL) 50 MG tablet Take 25-50 mg by mouth at bedtime as needed.     VENTOLIN HFA 108 (90 Base) MCG/ACT inhaler Inhale 1-2 puffs into the lungs every 6 (six) hours as needed.     No facility-administered medications prior to visit.    Review of Systems  Constitutional:  Negative for chills, fever, malaise/fatigue and weight loss.  HENT:  Negative for congestion, sinus pain and sore throat.   Eyes: Negative.   Respiratory:  Positive for shortness of breath and wheezing. Negative for cough, hemoptysis and sputum production.   Cardiovascular:  Negative for chest pain, palpitations, orthopnea, claudication and leg swelling.  Gastrointestinal:  Negative for abdominal pain, heartburn, nausea and vomiting.  Genitourinary: Negative.   Musculoskeletal:  Negative for joint pain and myalgias.  Skin:  Negative for rash.   Neurological:  Negative for weakness.  Endo/Heme/Allergies: Negative.   Psychiatric/Behavioral: Negative.        Objective:   Vitals:   09/23/22 1113  BP: 116/72  Pulse: 69  SpO2: 96%  Weight: 97 lb 3.2 oz (44.1 kg)  Height: 5' 0.5" (1.537 m)     Physical Exam Constitutional:      General: She is not in acute distress.    Appearance: She is not ill-appearing.  HENT:     Head: Normocephalic and atraumatic.  Eyes:     General: No scleral icterus.    Conjunctiva/sclera: Conjunctivae normal.     Pupils: Pupils are equal, round, and reactive to light.  Cardiovascular:     Rate and Rhythm: Normal rate and regular rhythm.     Pulses: Normal pulses.     Heart sounds: Normal heart sounds. No murmur heard. Pulmonary:     Effort: Pulmonary effort is normal.     Breath sounds: Normal breath sounds. No wheezing, rhonchi or rales.  Abdominal:     General: Bowel sounds are normal.     Palpations: Abdomen is soft.  Musculoskeletal:     Right lower leg: No edema.     Left lower leg: No edema.  Lymphadenopathy:     Cervical: No cervical adenopathy.  Skin:    General: Skin is warm and dry.  Neurological:     General: No focal deficit present.     Mental Status: She is alert.  Psychiatric:        Mood and Affect: Mood normal.        Behavior: Behavior normal.        Thought Content: Thought content normal.        Judgment: Judgment normal.       CBC    Component Value Date/Time   WBC 6.0 09/23/2022 1144   RBC 4.44 09/23/2022 1144   HGB 14.5 09/23/2022 1144   HCT 43.2 09/23/2022 1144   PLT 247.0 09/23/2022 1144   MCV 97.5 09/23/2022 1144   MCHC 33.6 09/23/2022 1144   RDW 13.3 09/23/2022 1144   LYMPHSABS 1.3 09/23/2022 1144   MONOABS 0.5 09/23/2022 1144   EOSABS 0.3 09/23/2022 1144   BASOSABS 0.1 09/23/2022 1144     Chest imaging:  PFT:     No data to display          Labs:  Path:  Echo:  Heart Catheterization:       Assessment & Plan:    Moderate persistent asthma without complication - Plan: CBC with Differential, IgE, Pulmonary Function Test, IgE, CBC with Differential  Discussion: Monica Castro is  a 71 year old woman, former smoker with history of asthma who is referred to pulmonary clinic for evaluation of her asthma.   She appears to have moderate persistent asthma.  We will try her on Trelegy Ellipta 1 puff daily in place of her Symbicort 160-4.5 mcg.  If she notices benefit from the Trelegy inhaler then she is to let us know and we will send in a prescription for her to continue on this medication.  She can continue to use as needed albuterol.  We will request records from the [facility] where she was followed for her asthma and pulmonary nodule.  She is to follow-up in 2 months with pulmonary function test.  Melody Comas, MD [facility] Office: [phone]   Current Outpatient Medications:    atorvastatin (LIPITOR) 10 MG tablet, Take 10 mg by mouth daily., Disp: , Rfl:    EPINEPHrine 0.3 mg/0.3 mL IJ SOAJ injection, Inject 0.3 mg into the muscle as needed for anaphylaxis., Disp: , Rfl:    fluticasone (FLONASE) 50 MCG/ACT nasal spray, Place 1 spray into both nostrils as needed for allergies or rhinitis., Disp: , Rfl:    Fluticasone-Umeclidin-Vilant (TRELEGY ELLIPTA) 100-62.5-25 MCG/ACT AEPB, Inhale 1 puff into the lungs daily., Disp: 2 each, Rfl: 0   levothyroxine (SYNTHROID) 75 MCG tablet, Take 75 mcg by mouth every morning., Disp: , Rfl:    LORazepam (ATIVAN) 2 MG tablet, Take 1-2 mg by mouth every 8 (eight) hours as needed., Disp: , Rfl:    PARoxetine (PAXIL) 40 MG tablet, Take 40 mg by mouth every morning., Disp: , Rfl:    SYMBICORT 160-4.5 MCG/ACT inhaler, Inhale 2 puffs into the lungs 2 (two) times daily., Disp: , Rfl:    traZODone (DESYREL) 50 MG tablet, Take 25-50 mg by mouth at bedtime as needed., Disp: , Rfl:    VENTOLIN HFA 108 (90 Base) MCG/ACT inhaler,  Inhale 1-2 puffs into the lungs every 6 (six) hours as needed., Disp: , Rfl:

## 2022-09-24 LAB — IGE: IgE (Immunoglobulin E), Serum: 80 kU/L (ref <=114)

## 2022-09-25 ENCOUNTER — Encounter: Payer: Self-pay | Admitting: Pulmonary Disease

## 2022-10-07 ENCOUNTER — Telehealth: Payer: Self-pay | Admitting: Pulmonary Disease

## 2022-10-07 MED ORDER — TRELEGY ELLIPTA 100-62.5-25 MCG/ACT IN AEPB: 1.0000 | INHALATION_SPRAY | Freq: Every day | RESPIRATORY_TRACT | 4 refills | Status: DC

## 2022-10-07 NOTE — Telephone Encounter (Signed)
Dr. Irena Cords gave Trelogy sample and she said it is working great and would like a script called in to her pharm. [facility] on [address]. Any questions call her [phone].

## 2022-10-07 NOTE — Telephone Encounter (Signed)
Called and spoke to patient and went over her wanting Trelegy sent into her pharmacy. Nothing further. Verified pharmacy and medication sent in

## 2022-11-12 ENCOUNTER — Ambulatory Visit (INDEPENDENT_AMBULATORY_CARE_PROVIDER_SITE_OTHER): Payer: Medicare Other | Admitting: Pulmonary Disease

## 2022-11-12 ENCOUNTER — Encounter: Payer: Self-pay | Admitting: Pulmonary Disease

## 2022-11-12 VITALS — BP 118/66 | HR 77 | Ht 60.5 in | Wt 97.0 lb

## 2022-11-12 DIAGNOSIS — J4540 Moderate persistent asthma, uncomplicated: Secondary | ICD-10-CM

## 2022-11-12 LAB — PULMONARY FUNCTION TEST
DL/VA % pred: 77 %
DL/VA: 3.27 ml/min/mmHg/L
DLCO cor % pred: 63 %
DLCO cor: 10.89 ml/min/mmHg
DLCO unc % pred: 63 %
DLCO unc: 10.89 ml/min/mmHg
FEF 25-75 Post: 0.45 L/sec
FEF 25-75 Pre: 0.34 L/sec
FEF2575-%Change-Post: 34 %
FEF2575-%Pred-Post: 28 %
FEF2575-%Pred-Pre: 20 %
FEV1-%Change-Post: 10 %
FEV1-%Pred-Post: 50 %
FEV1-%Pred-Pre: 45 %
FEV1-Post: 0.94 L
FEV1-Pre: 0.85 L
FEV1FVC-%Change-Post: -1 %
FEV1FVC-%Pred-Pre: 64 %
FEV6-%Change-Post: 11 %
FEV6-%Pred-Post: 79 %
FEV6-%Pred-Pre: 71 %
FEV6-Post: 1.9 L
FEV6-Pre: 1.71 L
FEV6FVC-%Change-Post: -1 %
FEV6FVC-%Pred-Post: 101 %
FEV6FVC-%Pred-Pre: 102 %
FVC-%Change-Post: 12 %
FVC-%Pred-Post: 78 %
FVC-%Pred-Pre: 69 %
FVC-Post: 1.97 L
FVC-Pre: 1.75 L
Post FEV1/FVC ratio: 48 %
Post FEV6/FVC ratio: 96 %
Pre FEV1/FVC ratio: 49 %
Pre FEV6/FVC Ratio: 97 %
RV % pred: 135 %
RV: 2.78 L
TLC % pred: 106 %
TLC: 4.82 L

## 2022-11-12 NOTE — Progress Notes (Signed)
Full PFT performed today.

## 2022-11-12 NOTE — Progress Notes (Signed)
Synopsis: Referred in November 2023 for asthma  Subjective:   PATIENT ID: Monica Castro GENDER: female DOB: [birth year], MRN: [id number]  HPI  Chief Complaint  Patient presents with   Follow-up    F/U after PFT. States the Trelegy has helped her breathing.    Monica Castro is a 72 year old woman, former smoker with history of asthma who returns to pulmonary clinic for evaluation of her asthma.   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   She reports the trelegy has helped her breathing symptoms tremendously.   Initial OV 09/23/22 She reports being diagnosed with asthma in 1973. She is currently on symbicort 160-4.73mcg 2 puffs twice daily. She reports her insurance company will not cover this medication after this year. She has tolerated breo in the past. She reports over the last month her asthma has been more active until it has quited down over the past 3 days. She reports over the past 1-1.5 years she has had an increase in dyspnea and chest tightness. She was using albuterol 3 times per day until the last couple of days. She has some sinus congestion and drainage.   She has a history of pulmonary nodule that was biopsied and benign.   She qui smoking in 1990. She smoked for 20 years.   History reviewed. No pertinent past medical history.   History reviewed. No pertinent family history.   Social History   Socioeconomic History   Marital status: Single    Spouse name: Not on file   Number of children: Not on file   Years of education: Not on file   Highest education level: Not on file  Occupational History   Not on file  Tobacco Use   Smoking status: Former    Types: Cigarettes    Start date: 05/30/1981    Quit date: 05/30/1989    Years since quitting: 33.4    Passive exposure: Never   Smokeless tobacco: Never  Substance and Sexual Activity   Alcohol use: Not on file   Drug use: Not on file   Sexual activity: Not on file  Other  Topics Concern   Not on file  Social History Narrative   Not on file   Social Determinants of Health   Financial Resource Strain: Not on file  Food Insecurity: Not on file  Transportation Needs: Not on file  Physical Activity: Not on file  Stress: Not on file  Social Connections: Not on file  Intimate Partner Violence: Not on file     Allergies  Allergen Reactions   Cashew Nut Oil Anaphylaxis    Per patient, 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    Aminophylline Other (See Comments)    Per patient, caused her heart to skip.      Outpatient Medications Prior to Visit  Medication Sig Dispense Refill   atorvastatin (LIPITOR) 10 MG tablet Take 10 mg by mouth daily.     EPINEPHrine 0.3 mg/0.3 mL IJ SOAJ injection Inject 0.3 mg into the muscle as needed for anaphylaxis.     fluticasone (FLONASE) 50 MCG/ACT nasal spray Place 1 spray into both nostrils as needed for allergies or rhinitis.     Fluticasone-Umeclidin-Vilant (TRELEGY ELLIPTA) 100-62.5-25 MCG/ACT AEPB Inhale 1 puff into the lungs daily. 60 each 4   levothyroxine (SYNTHROID) 75 MCG tablet Take 75 mcg by mouth every morning.     LORazepam (ATIVAN) 2 MG tablet Take 1-2 mg by mouth every 8 (eight)  hours as needed.     PARoxetine (PAXIL) 40 MG tablet Take 40 mg by mouth every morning.     traZODone (DESYREL) 50 MG tablet Take 25-50 mg by mouth at bedtime as needed.     VENTOLIN HFA 108 (90 Base) MCG/ACT inhaler Inhale 1-2 puffs into the lungs every 6 (six) hours as needed.     SYMBICORT 160-4.5 MCG/ACT inhaler Inhale 2 puffs into the lungs 2 (two) times daily.     No facility-administered medications prior to visit.    Review of Systems  Constitutional:  Negative for chills, fever, malaise/fatigue and weight loss.  HENT:  Negative for congestion, sinus pain and sore throat.   Eyes: Negative.   Respiratory:  Positive for shortness of breath and wheezing. Negative for cough, hemoptysis and sputum production.   Cardiovascular:   Negative for chest pain, palpitations, orthopnea, claudication and leg swelling.  Gastrointestinal:  Negative for abdominal pain, heartburn, nausea and vomiting.  Genitourinary: Negative.   Musculoskeletal:  Negative for joint pain and myalgias.  Skin:  Negative for rash.  Neurological:  Negative for weakness.  Endo/Heme/Allergies: Negative.   Psychiatric/Behavioral: Negative.      Objective:   Vitals:   11/12/22 1141  BP: 118/66  Pulse: 77  SpO2: 97%  Weight: 97 lb (44 kg)  Height: 5' 0.5" (1.537 m)     Physical Exam Constitutional:      General: She is not in acute distress.    Appearance: She is not ill-appearing.  HENT:     Head: Normocephalic and atraumatic.  Eyes:     General: No scleral icterus.    Conjunctiva/sclera: Conjunctivae normal.  Cardiovascular:     Rate and Rhythm: Normal rate and regular rhythm.     Pulses: Normal pulses.     Heart sounds: Normal heart sounds. No murmur heard. Pulmonary:     Effort: Pulmonary effort is normal.     Breath sounds: Normal breath sounds. No wheezing, rhonchi or rales.  Musculoskeletal:     Right lower leg: No edema.     Left lower leg: No edema.  Skin:    General: Skin is warm and dry.  Neurological:     General: No focal deficit present.     Mental Status: She is alert.    CBC    Component Value Date/Time   WBC 6.0 09/23/2022 1144   RBC 4.44 09/23/2022 1144   HGB 14.5 09/23/2022 1144   HCT 43.2 09/23/2022 1144   PLT 247.0 09/23/2022 1144   MCV 97.5 09/23/2022 1144   MCHC 33.6 09/23/2022 1144   RDW 13.3 09/23/2022 1144   LYMPHSABS 1.3 09/23/2022 1144   MONOABS 0.5 09/23/2022 1144   EOSABS 0.3 09/23/2022 1144   BASOSABS 0.1 09/23/2022 1144       No data to display          Chest imaging:  PFT:    Latest Ref Rng & Units 11/12/2022    9:55 AM  PFT Results  FVC-Pre L 1.75   FVC-Predicted Pre % 69   FVC-Post L 1.97   FVC-Predicted Post % 78   Pre FEV1/FVC % % 49   Post FEV1/FCV % % 48    FEV1-Pre L 0.85   FEV1-Predicted Pre % 45   FEV1-Post L 0.94   DLCO uncorrected ml/min/mmHg 10.89   DLCO UNC% % 63   DLCO corrected ml/min/mmHg 10.89   DLCO COR %Predicted % 63   DLVA Predicted % 77   TLC L  4.82   TLC % Predicted % 106   RV % Predicted % 135     Labs:  Path:  Echo:  Heart Catheterization:       Assessment & Plan:   Moderate persistent asthma without complication  Discussion: Monica Castro is a 72 year old woman, former smoker with history of asthma who returns to pulmonary clinic for asthma.   She appears to have moderate persistent asthma.  She is to continue Trelegy Ellipta 1 puff daily. She can continue to use as needed albuterol.  We will request records from the [facility] where she was followed for her asthma and pulmonary nodule.  PFTs today show mixed obstructive and restrictive defects with mild diffusion defect.   She is to follow-up in 1 year.  Freda Jackson, MD [facility] Office: [phone]   Current Outpatient Medications:    atorvastatin (LIPITOR) 10 MG tablet, Take 10 mg by mouth daily., Disp: , Rfl:    EPINEPHrine 0.3 mg/0.3 mL IJ SOAJ injection, Inject 0.3 mg into the muscle as needed for anaphylaxis., Disp: , Rfl:    fluticasone (FLONASE) 50 MCG/ACT nasal spray, Place 1 spray into both nostrils as needed for allergies or rhinitis., Disp: , Rfl:    Fluticasone-Umeclidin-Vilant (TRELEGY ELLIPTA) 100-62.5-25 MCG/ACT AEPB, Inhale 1 puff into the lungs daily., Disp: 60 each, Rfl: 4   levothyroxine (SYNTHROID) 75 MCG tablet, Take 75 mcg by mouth every morning., Disp: , Rfl:    LORazepam (ATIVAN) 2 MG tablet, Take 1-2 mg by mouth every 8 (eight) hours as needed., Disp: , Rfl:    PARoxetine (PAXIL) 40 MG tablet, Take 40 mg by mouth every morning., Disp: , Rfl:    traZODone (DESYREL) 50 MG tablet, Take 25-50 mg by mouth at bedtime as needed., Disp: , Rfl:    VENTOLIN HFA 108 (90 Base)  MCG/ACT inhaler, Inhale 1-2 puffs into the lungs every 6 (six) hours as needed., Disp: , Rfl:

## 2022-11-12 NOTE — Patient Instructions (Signed)
Full PFT performed today.

## 2022-11-12 NOTE — Patient Instructions (Addendum)
Continue trelegy inhaler 1 puff daily - rinse mouth out as needed  Use albuterol inhaler as needed  We will request your records from [facility]  Follow up in 1 year

## 2022-11-16 ENCOUNTER — Encounter: Payer: Self-pay | Admitting: Pulmonary Disease

## 2022-12-05 DIAGNOSIS — Z1389 Encounter for screening for other disorder: Secondary | ICD-10-CM | POA: Diagnosis not present

## 2022-12-05 DIAGNOSIS — Z0000 Encounter for general adult medical examination without abnormal findings: Secondary | ICD-10-CM | POA: Diagnosis not present

## 2022-12-05 DIAGNOSIS — Z681 Body mass index (BMI) 19 or less, adult: Secondary | ICD-10-CM | POA: Diagnosis not present

## 2022-12-10 DIAGNOSIS — Z681 Body mass index (BMI) 19 or less, adult: Secondary | ICD-10-CM | POA: Diagnosis not present

## 2022-12-10 DIAGNOSIS — L989 Disorder of the skin and subcutaneous tissue, unspecified: Secondary | ICD-10-CM | POA: Diagnosis not present

## 2022-12-10 DIAGNOSIS — Z23 Encounter for immunization: Secondary | ICD-10-CM | POA: Diagnosis not present

## 2022-12-10 DIAGNOSIS — E039 Hypothyroidism, unspecified: Secondary | ICD-10-CM | POA: Diagnosis not present

## 2022-12-10 DIAGNOSIS — E7800 Pure hypercholesterolemia, unspecified: Secondary | ICD-10-CM | POA: Diagnosis not present

## 2023-01-12 DIAGNOSIS — M818 Other osteoporosis without current pathological fracture: Secondary | ICD-10-CM | POA: Diagnosis not present

## 2023-01-21 DIAGNOSIS — K219 Gastro-esophageal reflux disease without esophagitis: Secondary | ICD-10-CM | POA: Diagnosis not present

## 2023-02-23 ENCOUNTER — Other Ambulatory Visit: Payer: Self-pay | Admitting: Pulmonary Disease

## 2023-06-12 DIAGNOSIS — E7800 Pure hypercholesterolemia, unspecified: Secondary | ICD-10-CM | POA: Diagnosis not present

## 2023-06-12 DIAGNOSIS — F339 Major depressive disorder, recurrent, unspecified: Secondary | ICD-10-CM | POA: Diagnosis not present

## 2023-06-12 DIAGNOSIS — M810 Age-related osteoporosis without current pathological fracture: Secondary | ICD-10-CM | POA: Diagnosis not present

## 2023-06-12 DIAGNOSIS — Z681 Body mass index (BMI) 19 or less, adult: Secondary | ICD-10-CM | POA: Diagnosis not present

## 2023-06-12 DIAGNOSIS — E039 Hypothyroidism, unspecified: Secondary | ICD-10-CM | POA: Diagnosis not present

## 2023-07-16 DIAGNOSIS — M810 Age-related osteoporosis without current pathological fracture: Secondary | ICD-10-CM | POA: Diagnosis not present

## 2023-09-08 DIAGNOSIS — Z23 Encounter for immunization: Secondary | ICD-10-CM | POA: Diagnosis not present

## 2023-10-05 ENCOUNTER — Other Ambulatory Visit: Payer: Self-pay | Admitting: Family Medicine

## 2023-10-05 DIAGNOSIS — Z1231 Encounter for screening mammogram for malignant neoplasm of breast: Secondary | ICD-10-CM

## 2023-11-03 ENCOUNTER — Ambulatory Visit
Admission: RE | Admit: 2023-11-03 | Discharge: 2023-11-03 | Disposition: A | Discharge status: Home / Self Care | Payer: Medicare Other | Source: Ambulatory Visit | Attending: Family Medicine | Admitting: Family Medicine

## 2023-11-03 DIAGNOSIS — Z1231 Encounter for screening mammogram for malignant neoplasm of breast: Secondary | ICD-10-CM

## 2023-12-15 ENCOUNTER — Other Ambulatory Visit: Payer: Self-pay | Admitting: Family Medicine

## 2023-12-15 DIAGNOSIS — M810 Age-related osteoporosis without current pathological fracture: Secondary | ICD-10-CM | POA: Diagnosis not present

## 2023-12-15 DIAGNOSIS — F3341 Major depressive disorder, recurrent, in partial remission: Secondary | ICD-10-CM | POA: Diagnosis not present

## 2023-12-15 DIAGNOSIS — E039 Hypothyroidism, unspecified: Secondary | ICD-10-CM | POA: Diagnosis not present

## 2023-12-15 DIAGNOSIS — E7800 Pure hypercholesterolemia, unspecified: Secondary | ICD-10-CM | POA: Diagnosis not present

## 2023-12-15 DIAGNOSIS — Z681 Body mass index (BMI) 19 or less, adult: Secondary | ICD-10-CM | POA: Diagnosis not present

## 2023-12-21 DIAGNOSIS — M810 Age-related osteoporosis without current pathological fracture: Secondary | ICD-10-CM | POA: Diagnosis not present

## 2024-01-01 DIAGNOSIS — Z780 Asymptomatic menopausal state: Secondary | ICD-10-CM | POA: Diagnosis not present

## 2024-01-01 DIAGNOSIS — M810 Age-related osteoporosis without current pathological fracture: Secondary | ICD-10-CM | POA: Diagnosis not present

## 2024-01-08 ENCOUNTER — Other Ambulatory Visit: Payer: Self-pay | Admitting: Pulmonary Disease

## 2024-02-01 ENCOUNTER — Other Ambulatory Visit: Payer: Self-pay | Admitting: Pulmonary Disease

## 2024-02-02 ENCOUNTER — Other Ambulatory Visit: Payer: Self-pay | Admitting: Pulmonary Disease

## 2024-02-02 MED ORDER — TRELEGY ELLIPTA 100-62.5-25 MCG/ACT IN AEPB: 1.0000 | INHALATION_SPRAY | Freq: Every day | RESPIRATORY_TRACT | 0 refills | Status: DC

## 2024-02-02 NOTE — Telephone Encounter (Signed)
 Copied from CRM [id number]. Topic: Clinical - Medication Refill >> Feb 02, 2024  9:28 AM Konrad Dolores wrote: Most Recent Primary Care Visit:   Medication: TRELEGY ELLIPTA 100-62.5-25 MCG/ACT AEPB   Has the patient contacted their pharmacy? Yes; a message was sent to her from her pharmacy stating she had no refills to reach out to her care provider.  (Agent: If no, request that the patient contact the pharmacy for the refill. If patient does not wish to contact the pharmacy document the reason why and proceed with request.) (Agent: If yes, when and what did the pharmacy advise?)  Is this the correct pharmacy for this prescription? Yes If no, delete pharmacy and type the correct one.  This is the patient's preferred pharmacy:  [facility] [address] [place] - [address] [place] [address] Phone: [phone] Fax: [phone]   Has the prescription been filled recently? Yes  Is the patient out of the medication? No  Has the patient been seen for an appointment in the last year OR does the patient have an upcoming appointment? Yes  Can we respond through MyChart? No; patient isn't on her computer much for mychart so please reach out via phone call for any concerns with this refill request.   Agent: Please be advised that Rx refills may take up to 3 business days. We ask that you follow-up with your pharmacy.

## 2024-02-19 DIAGNOSIS — S0990XA Unspecified injury of head, initial encounter: Secondary | ICD-10-CM | POA: Diagnosis not present

## 2024-02-23 DIAGNOSIS — M810 Age-related osteoporosis without current pathological fracture: Secondary | ICD-10-CM | POA: Diagnosis not present

## 2024-02-28 ENCOUNTER — Other Ambulatory Visit: Payer: Self-pay | Admitting: Pulmonary Disease

## 2024-03-01 ENCOUNTER — Telehealth: Payer: Self-pay | Admitting: Pulmonary Disease

## 2024-03-01 ENCOUNTER — Encounter (HOSPITAL_BASED_OUTPATIENT_CLINIC_OR_DEPARTMENT_OTHER): Payer: Self-pay

## 2024-03-01 ENCOUNTER — Other Ambulatory Visit (HOSPITAL_BASED_OUTPATIENT_CLINIC_OR_DEPARTMENT_OTHER): Payer: Self-pay

## 2024-03-01 MED ORDER — TRELEGY ELLIPTA 100-62.5-25 MCG/ACT IN AEPB: 1.0000 | INHALATION_SPRAY | Freq: Every day | RESPIRATORY_TRACT | 0 refills | Status: DC

## 2024-03-01 NOTE — Telephone Encounter (Unsigned)
 Copied from CRM [id number]. Topic: Clinical - Medication Refill >> Mar 01, 2024  2:15 PM Hilton Lucky wrote: Most Recent Primary Care Visit:   Medication: Fluticasone-Umeclidin-Vilant (TRELEGY ELLIPTA ) 100-62.5-25 MCG/ACT AEPB  Has the patient contacted their pharmacy? Yes - Needs Refill - Requesting it be auto-renewed for the next few months if possible.   Is this the correct pharmacy for this prescription? Yes  This is the patient's preferred pharmacy:  [facility] [address] [place] - [address] [place] [address] Phone: [phone] Fax: [phone]   Has the prescription been filled recently? No  Is the patient out of the medication? Yes  Has the patient been seen for an appointment in the last year OR does the patient have an upcoming appointment? Yes  Can we respond through MyChart? No - Phone Call  Agent: Please be advised that Rx refills may take up to 3 business days. We ask that you follow-up with your pharmacy.

## 2024-03-01 NOTE — Telephone Encounter (Signed)
 Rx sent to pharmacy and pt notified  ?

## 2024-03-08 DIAGNOSIS — M810 Age-related osteoporosis without current pathological fracture: Secondary | ICD-10-CM | POA: Diagnosis not present

## 2024-03-27 ENCOUNTER — Other Ambulatory Visit (HOSPITAL_BASED_OUTPATIENT_CLINIC_OR_DEPARTMENT_OTHER): Payer: Self-pay | Admitting: Pulmonary Disease

## 2024-03-29 ENCOUNTER — Other Ambulatory Visit: Payer: Self-pay | Admitting: Pulmonary Disease

## 2024-03-29 NOTE — Telephone Encounter (Unsigned)
 Copied from CRM [id number]. Topic: Clinical - Prescription Issue >> Mar 29, 2024  4:46 PM Monica Castro G wrote: Reason for CRM:PT CALLED STATED THE PHARMACY CALLED HER AND ADVISED HER THE Fluticasone-Umeclidin-Vilant (TRELEGY ELLIPTA ) 100-62.5-25 MCG/ACT AEPB AND IF SHE CAN HAVE A REFILL UNITL HER APPT ON 06/16. STATED SHE WILL BE OUT OF THE MEDS IN ABOUT A WEEK.

## 2024-03-30 MED ORDER — TRELEGY ELLIPTA 100-62.5-25 MCG/ACT IN AEPB: 1.0000 | INHALATION_SPRAY | Freq: Every day | RESPIRATORY_TRACT | 0 refills | Status: DC

## 2024-04-11 ENCOUNTER — Encounter: Payer: Self-pay | Admitting: Pulmonary Disease

## 2024-04-11 ENCOUNTER — Ambulatory Visit (INDEPENDENT_AMBULATORY_CARE_PROVIDER_SITE_OTHER): Admitting: Pulmonary Disease

## 2024-04-11 VITALS — BP 125/72 | HR 63 | Ht 61.0 in | Wt 94.0 lb

## 2024-04-11 DIAGNOSIS — J4540 Moderate persistent asthma, uncomplicated: Secondary | ICD-10-CM | POA: Diagnosis not present

## 2024-04-11 DIAGNOSIS — J4489 Other specified chronic obstructive pulmonary disease: Secondary | ICD-10-CM | POA: Diagnosis not present

## 2024-04-11 DIAGNOSIS — Z87891 Personal history of nicotine dependence: Secondary | ICD-10-CM

## 2024-04-11 MED ORDER — TRELEGY ELLIPTA 100-62.5-25 MCG/ACT IN AEPB: 1.0000 | INHALATION_SPRAY | Freq: Every day | RESPIRATORY_TRACT | 6 refills | Status: DC

## 2024-04-11 MED ORDER — VENTOLIN HFA 108 (90 BASE) MCG/ACT IN AERS: 1.0000 | INHALATION_SPRAY | Freq: Four times a day (QID) | RESPIRATORY_TRACT | 5 refills | Status: AC | PRN

## 2024-04-11 NOTE — Progress Notes (Addendum)
 Synopsis: Referred in November 2023 for asthma  Subjective:   PATIENT ID: Monica Castro GENDER: female DOB: August 19, 1951, MRN: [id number]  HPI  Chief Complaint  Patient presents with   Follow-up    Pt state trelegy has worked wonders.   Monica Castro is a 73 year old woman, former smoker with history of asthma who returns to pulmonary clinic for evaluation of her asthma.   Her breathing remains stable with no exacerbations over the past year, attributed to Trelegy. She experiences significant improvement in exercise tolerance and can engage in activities longer without dyspnea. She recently adjusted her Trelegy intake to 2 PM, noticing clearer lungs and reduced mucus in the afternoon. She has not used her albuterol  inhaler since starting Trelegy. There is no new shortness of breath or limitations in daily activities.  OV 11/12/22 PFTs showed mixed obstructive and restrictive defects with mild diffusion defect.   She reports the trelegy has helped her breathing symptoms tremendously.   Initial OV 09/23/22 She reports being diagnosed with asthma in 1973. She is currently on symbicort 160-4.5mcg 2 puffs twice daily. She reports her insurance company will not cover this medication after this year. She has tolerated breo in the past. She reports over the last month her asthma has been more active until it has quited down over the past 3 days. She reports over the past 1-1.5 years she has had an increase in dyspnea and chest tightness. She was using albuterol  3 times per day until the last couple of days. She has some sinus congestion and drainage.   She has a history of pulmonary nodule that was biopsied and benign.   She qui smoking in 1990. She smoked for 20 years.   History reviewed. No pertinent past medical history.   History reviewed. No pertinent family history.   Social History   Socioeconomic History   Marital status: Single    Spouse name: Not on file   Number of children: Not on  file   Years of education: Not on file   Highest education level: Not on file  Occupational History   Not on file  Tobacco Use   Smoking status: Former    Current packs/day: 0.00    Types: Cigarettes    Start date: 05/30/1981    Quit date: 05/30/1989    Years since quitting: 34.9    Passive exposure: Never   Smokeless tobacco: Never  Substance and Sexual Activity   Alcohol use: Not on file   Drug use: Not on file   Sexual activity: Not on file  Other Topics Concern   Not on file  Social History Narrative   Not on file   Social Drivers of Health   Financial Resource Strain: Not on file  Food Insecurity: Not on file  Transportation Needs: Not on file  Physical Activity: Not on file  Stress: Not on file  Social Connections: Not on file  Intimate Partner Violence: Not on file     Allergies  Allergen Reactions   Cashew Nut Oil Anaphylaxis    Per patient, 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    Aminophylline Other (See Comments)    Per patient, caused her heart to skip.      Outpatient Medications Prior to Visit  Medication Sig Dispense Refill   atorvastatin (LIPITOR) 10 MG tablet Take 10 mg by mouth daily.     EPINEPHrine 0.3 mg/0.3 mL IJ SOAJ injection Inject 0.3 mg into the muscle as needed for anaphylaxis.  fluticasone (FLONASE) 50 MCG/ACT nasal spray Place 1 spray into both nostrils as needed for allergies or rhinitis.     levothyroxine (SYNTHROID) 75 MCG tablet Take 75 mcg by mouth every morning.     LORazepam (ATIVAN) 2 MG tablet Take 1-2 mg by mouth every 8 (eight) hours as needed.     PARoxetine (PAXIL) 40 MG tablet Take 40 mg by mouth every morning.     traZODone (DESYREL) 50 MG tablet Take 25-50 mg by mouth at bedtime as needed.     Fluticasone-Umeclidin-Vilant (TRELEGY ELLIPTA ) 100-62.5-25 MCG/ACT AEPB Inhale 1 puff into the lungs daily at 2 PM. 60 each 0   VENTOLIN  HFA 108 (90 Base) MCG/ACT inhaler Inhale 1-2 puffs into the lungs every 6 (six) hours as needed.      No facility-administered medications prior to visit.    Review of Systems  Constitutional:  Negative for chills, fever, malaise/fatigue and weight loss.  HENT:  Negative for congestion, sinus pain and sore throat.   Eyes: Negative.   Respiratory:  Negative for cough, hemoptysis, sputum production, shortness of breath and wheezing.   Cardiovascular:  Negative for chest pain, palpitations, orthopnea, claudication and leg swelling.  Gastrointestinal:  Negative for abdominal pain, heartburn, nausea and vomiting.  Genitourinary: Negative.   Musculoskeletal:  Negative for joint pain and myalgias.  Skin:  Negative for rash.  Neurological:  Negative for weakness.  Endo/Heme/Allergies: Negative.   Psychiatric/Behavioral: Negative.      Objective:   Vitals:   04/11/24 0907  BP: 125/72  Pulse: 63  SpO2: 95%  Weight: 94 lb (42.6 kg)  Height: 5' 1 (1.549 m)      Physical Exam Constitutional:      General: She is not in acute distress.    Appearance: She is not ill-appearing.  HENT:     Head: Normocephalic and atraumatic.   Eyes:     General: No scleral icterus.    Conjunctiva/sclera: Conjunctivae normal.    Cardiovascular:     Rate and Rhythm: Normal rate and regular rhythm.     Pulses: Normal pulses.     Heart sounds: Normal heart sounds. No murmur heard. Pulmonary:     Effort: Pulmonary effort is normal.     Breath sounds: Normal breath sounds. No wheezing, rhonchi or rales.   Musculoskeletal:     Right lower leg: No edema.     Left lower leg: No edema.   Skin:    General: Skin is warm and dry.   Neurological:     Mental Status: She is alert.    CBC    Component Value Date/Time   WBC 6.0 09/23/2022 1144   RBC 4.44 09/23/2022 1144   HGB 14.5 09/23/2022 1144   HCT 43.2 09/23/2022 1144   PLT 247.0 09/23/2022 1144   MCV 97.5 09/23/2022 1144   MCHC 33.6 09/23/2022 1144   RDW 13.3 09/23/2022 1144   LYMPHSABS 1.3 09/23/2022 1144   MONOABS 0.5 09/23/2022  1144   EOSABS 0.3 09/23/2022 1144   BASOSABS 0.1 09/23/2022 1144       No data to display          Chest imaging:  PFT:    Latest Ref Rng & Units 11/12/2022    9:55 AM  PFT Results  FVC-Pre L 1.75   FVC-Predicted Pre % 69   FVC-Post L 1.97   FVC-Predicted Post % 78   Pre FEV1/FVC % % 49   Post FEV1/FCV % % 48  FEV1-Pre L 0.85   FEV1-Predicted Pre % 45   FEV1-Post L 0.94   DLCO uncorrected ml/min/mmHg 10.89   DLCO UNC% % 63   DLCO corrected ml/min/mmHg 10.89   DLCO COR %Predicted % 63   DLVA Predicted % 77   TLC L 4.82   TLC % Predicted % 106   RV % Predicted % 135     Labs:  Path:  Echo:  Heart Catheterization:       Assessment & Plan:   Asthma-COPD overlap syndrome (HCC) - Plan: Fluticasone-Umeclidin-Vilant (TRELEGY ELLIPTA ) 100-62.5-25 MCG/ACT AEPB, VENTOLIN  HFA 108 (90 Base) MCG/ACT inhaler  Discussion: Cherryl Babin is a 73 year old woman, former smoker with history of asthma who returns to pulmonary clinic for asthma.   She appears to have moderate persistent asthma.  She is to continue Trelegy Ellipta  1 puff daily. She can continue to use as needed albuterol .  She is to follow-up in 1 year.  Dorn Chill, MD [facility] Office: [phone]   Current Outpatient Medications:    atorvastatin (LIPITOR) 10 MG tablet, Take 10 mg by mouth daily., Disp: , Rfl:    EPINEPHrine 0.3 mg/0.3 mL IJ SOAJ injection, Inject 0.3 mg into the muscle as needed for anaphylaxis., Disp: , Rfl:    fluticasone (FLONASE) 50 MCG/ACT nasal spray, Place 1 spray into both nostrils as needed for allergies or rhinitis., Disp: , Rfl:    Fluticasone-Umeclidin-Vilant (TRELEGY ELLIPTA ) 100-62.5-25 MCG/ACT AEPB, Inhale 1 puff into the lungs daily at 2 PM., Disp: 60 each, Rfl: 6   levothyroxine (SYNTHROID) 75 MCG tablet, Take 75 mcg by mouth every morning., Disp: , Rfl:    LORazepam (ATIVAN) 2 MG tablet, Take 1-2 mg by mouth every 8 (eight) hours as needed.,  Disp: , Rfl:    PARoxetine (PAXIL) 40 MG tablet, Take 40 mg by mouth every morning., Disp: , Rfl:    traZODone (DESYREL) 50 MG tablet, Take 25-50 mg by mouth at bedtime as needed., Disp: , Rfl:    VENTOLIN  HFA 108 (90 Base) MCG/ACT inhaler, Inhale 1-2 puffs into the lungs every 6 (six) hours as needed., Disp: 6.7 g, Rfl: 5

## 2024-04-11 NOTE — Patient Instructions (Addendum)
 Continue trelegy ellipta 1 puff daily - rinse mouth out after each use  Use albuterol inhaler 1-2 puffs every 4-6 hours as needed  Follow up in 1 year, call sooner if needed

## 2024-04-17 ENCOUNTER — Encounter: Payer: Self-pay | Admitting: Pulmonary Disease

## 2024-08-02 DIAGNOSIS — M810 Age-related osteoporosis without current pathological fracture: Secondary | ICD-10-CM | POA: Diagnosis not present

## 2024-08-02 DIAGNOSIS — R2240 Localized swelling, mass and lump, unspecified lower limb: Secondary | ICD-10-CM | POA: Diagnosis not present

## 2024-08-02 DIAGNOSIS — E039 Hypothyroidism, unspecified: Secondary | ICD-10-CM | POA: Diagnosis not present

## 2024-08-02 DIAGNOSIS — Z23 Encounter for immunization: Secondary | ICD-10-CM | POA: Diagnosis not present

## 2024-08-02 DIAGNOSIS — F339 Major depressive disorder, recurrent, unspecified: Secondary | ICD-10-CM | POA: Diagnosis not present

## 2024-08-02 DIAGNOSIS — G4700 Insomnia, unspecified: Secondary | ICD-10-CM | POA: Diagnosis not present

## 2024-08-02 DIAGNOSIS — F418 Other specified anxiety disorders: Secondary | ICD-10-CM | POA: Diagnosis not present

## 2024-08-02 DIAGNOSIS — Z0000 Encounter for general adult medical examination without abnormal findings: Secondary | ICD-10-CM | POA: Diagnosis not present

## 2024-08-02 DIAGNOSIS — E7800 Pure hypercholesterolemia, unspecified: Secondary | ICD-10-CM | POA: Diagnosis not present

## 2024-08-09 ENCOUNTER — Other Ambulatory Visit: Payer: Medicare Other

## 2024-09-09 DIAGNOSIS — M810 Age-related osteoporosis without current pathological fracture: Secondary | ICD-10-CM | POA: Diagnosis not present

## 2024-11-29 ENCOUNTER — Other Ambulatory Visit: Payer: Self-pay | Admitting: Pulmonary Disease

## 2024-11-29 DIAGNOSIS — J4489 Other specified chronic obstructive pulmonary disease: Secondary | ICD-10-CM

## 2024-11-29 MED ORDER — TRELEGY ELLIPTA 100-62.5-25 MCG/ACT IN AEPB: 1.0000 | INHALATION_SPRAY | Freq: Every day | RESPIRATORY_TRACT | 4 refills | Status: AC

## 2024-11-29 NOTE — Telephone Encounter (Signed)
 Copied from CRM [id number]. Topic: Clinical - Medication Refill >> Nov 29, 2024  9:15 AM LaVerne A wrote: Medication: Fluticasone-Umeclidin-Vilant (TRELEGY ELLIPTA ) 100-62.5-25 MCG/ACT AEPB  Has the patient contacted their pharmacy? Yes (Agent: If no, request that the patient contact the pharmacy for the refill. If patient does not wish to contact the pharmacy document the reason why and proceed with request.) (Agent: If yes, when and what did the pharmacy advise?)  This is the patient's preferred pharmacy:  [facility] [address] [place] - [address] [place] [address] Phone: [phone] Fax: [phone]  Is this the correct pharmacy for this prescription? Yes If no, delete pharmacy and type the correct one.   Has the prescription been filled recently? Yes  Is the patient out of the medication? No, has 4 more doses  Has the patient been seen for an appointment in the last year OR does the patient have an upcoming appointment? Yes  Can we respond through MyChart? Yes  Agent: Please be advised that Rx refills may take up to 3 business days. We ask that you follow-up with your pharmacy.   Rx refilled.
